# Patient Record
Sex: Female | Born: 1955
Health system: Southern US, Community
[De-identification: ages and names within clinical notes are randomized; demographics above are authoritative.]

## PROBLEM LIST (undated history)

## (undated) DIAGNOSIS — E785 Hyperlipidemia, unspecified: Secondary | ICD-10-CM

## (undated) DIAGNOSIS — Z9889 Other specified postprocedural states: Secondary | ICD-10-CM

## (undated) DIAGNOSIS — R112 Nausea with vomiting, unspecified: Secondary | ICD-10-CM

## (undated) DIAGNOSIS — T7840XA Allergy, unspecified, initial encounter: Secondary | ICD-10-CM

## (undated) DIAGNOSIS — E079 Disorder of thyroid, unspecified: Secondary | ICD-10-CM

## (undated) DIAGNOSIS — K219 Gastro-esophageal reflux disease without esophagitis: Secondary | ICD-10-CM

## (undated) DIAGNOSIS — H269 Unspecified cataract: Secondary | ICD-10-CM

## (undated) DIAGNOSIS — J449 Chronic obstructive pulmonary disease, unspecified: Secondary | ICD-10-CM

## (undated) DIAGNOSIS — I10 Essential (primary) hypertension: Secondary | ICD-10-CM

## (undated) HISTORY — DX: Unspecified cataract: H26.9

## (undated) HISTORY — PX: BUNIONECTOMY: SHX129

## (undated) HISTORY — PX: OTHER SURGICAL HISTORY: SHX169

## (undated) HISTORY — PX: APPENDECTOMY: SHX54

## (undated) HISTORY — DX: Allergy, unspecified, initial encounter: T78.40XA

## (undated) HISTORY — DX: Hyperlipidemia, unspecified: E78.5

## (undated) HISTORY — PX: CHOLECYSTECTOMY: SHX55

## (undated) HISTORY — PX: THYROIDECTOMY: SHX17

## (undated) HISTORY — DX: Gastro-esophageal reflux disease without esophagitis: K21.9

## (undated) HISTORY — PX: BLADDER SUSPENSION: SHX72

---

## 2008-06-18 ENCOUNTER — Emergency Department (HOSPITAL_COMMUNITY): Admission: EM | Admit: 2008-06-18 | Discharge: 2008-06-18 | Payer: Self-pay | Admitting: Emergency Medicine

## 2008-06-18 ENCOUNTER — Ambulatory Visit: Payer: Self-pay | Admitting: Diagnostic Radiology

## 2008-06-24 ENCOUNTER — Ambulatory Visit: Payer: Self-pay | Admitting: Diagnostic Radiology

## 2008-11-15 ENCOUNTER — Ambulatory Visit (HOSPITAL_BASED_OUTPATIENT_CLINIC_OR_DEPARTMENT_OTHER): Admission: RE | Admit: 2008-11-15 | Discharge: 2008-11-15 | Payer: Self-pay | Admitting: Plastic Surgery

## 2010-09-23 LAB — POCT HEMOGLOBIN-HEMACUE: Hemoglobin: 13.4 g/dL (ref 12.0–15.0)

## 2010-09-24 LAB — BASIC METABOLIC PANEL
BUN: 15 mg/dL (ref 6–23)
Chloride: 104 mEq/L (ref 96–112)
GFR calc Af Amer: >60 mL/min (ref >60)
GFR calc non Af Amer: >60 mL/min (ref >60)
Potassium: 4.3 mEq/L (ref 3.5–5.1)

## 2010-10-29 NOTE — Op Note (Signed)
NAMEBRAILEIGH, LANDENBERGER                   ACCOUNT NO.:  [id number]   MEDICAL RECORD NO.:  [id number]          PATIENT TYPE:  AMB   LOCATION:  DSC                          FACILITY:  [facility]   PHYSICIAN:  Loreta Ave, MD DATE OF BIRTH:  02/27/1956   DATE OF PROCEDURE:  11/15/2008  DATE OF DISCHARGE:                               OPERATIVE REPORT   PREOPERATIVE DIAGNOSIS:  Extensor tendon irritation secondary to left  distal radius volar plate.   POSTOPERATIVE DIAGNOSIS:  Extensor tendon irritation secondary to left  distal radius volar plate.   PROCEDURE PERFORMED:  Removal of left wrist volar plate.   SURGEON:  Loreta Ave, MD   ASSISTANT:  None.   ANESTHESIA:  General.   TOURNIQUET TIME:  20 minutes, 250 mmHg.   COMPLICATIONS:  None.   DRAINS:  None.   CLINICAL INDICATION:  Maxcine Strong is a 55 year old female who suffered a  left distal radius fracture in January 2010.  I saw her at that time and  recommended operative treatment and subsequently placed a Accumed Acu-  Loc distal radius plate on the volar aspect of her left distal radius.  She recovered very well until about 1 month ago when she returned to  clinic with complaints of pain along the dorsal aspect of her distal  radius corresponding to screws that were protruding through the dorsal  aspect of the distal radius.  She also had complaints of extensor tendon  pain to her index finger.  This seemed to have a focus about her distal  radius plate and radiate distally.  We discussed the options and tried  conservative therapy with anti-inflammatories for a few weeks.  Conservative therapy did not resolve her symptoms, and she presents at  this time for hardware explantation.   After discussion of the risks of surgery which include but are not  limited to bleeding, infection, distal radius fracture, fracture  collapse, failure to cure her symptoms.  Kyomi understands these risks and  desires to proceed.   DESCRIPTION OF THE OPERATION:  The patient was brought to the operating  room, placed in the supine position on the operating room table.  A well-  padded pneumatic tourniquet was placed on the left arm.  The left upper  extremity was prepped with Betadine scrub and paint and draped into a  sterile field.  It was exsanguinated with the Esmarch bandage and the  tourniquet inflated to 250 mmHg.  Her old scar along the volar surface  of the FCR tendon sheath on the skin was somewhat hypertrophic.  For  this reason, it was excised with a 15 blade.  The anterior aspect of the  FCR tendon sheath was incised with tenotomy scissors and the FCR tendon  retracted ulnarly.  Dissection proceeded along the pronator quadratus  and the plate was immediately visible.  Cicatrix was dissected off the  plate with a Key elevator.  Next, the screws in the plate were removed  in a stepwise fashion starting distally and progressing proximally.  After all screws were removed, the plate was gently elevated  off the  volar aspect of the distal radius.  All the screws were tightly adherent  to the plate and to the distal radius.  There was no screw loosening.  Next, the wound was irrigated with normal saline and 10 mL of 0.5%  Marcaine plain were injected about the wound and along the skin edges.  The skin was then closed with 4-0 buried interrupted Monocryl sutures  and a running 4-0 subcuticular Monocryl stitch.  Steri-Strips and  Benzoin were applied.  The tourniquet was deflated and pink color was  noted to return in all fingers of the left hand.  Volar resting splint  was applied.  Sponge and needle counts were reported as correct.  The  patient was extubated and transferred to the recovery room in stable  condition.      Loreta Ave, MD  Electronically Signed     CF/MEDQ  D:  11/15/2008  T:  11/15/2008  Job:  [id number]

## 2011-05-08 ENCOUNTER — Encounter: Payer: Self-pay | Admitting: *Deleted

## 2011-05-08 ENCOUNTER — Emergency Department (HOSPITAL_BASED_OUTPATIENT_CLINIC_OR_DEPARTMENT_OTHER)
Admission: EM | Admit: 2011-05-08 | Discharge: 2011-05-08 | Disposition: A | Discharge status: Home / Self Care | Payer: BC Managed Care – PPO | Attending: Emergency Medicine | Admitting: Emergency Medicine

## 2011-05-08 DIAGNOSIS — N390 Urinary tract infection, site not specified: Secondary | ICD-10-CM

## 2011-05-08 DIAGNOSIS — I10 Essential (primary) hypertension: Secondary | ICD-10-CM | POA: Insufficient documentation

## 2011-05-08 DIAGNOSIS — E079 Disorder of thyroid, unspecified: Secondary | ICD-10-CM | POA: Insufficient documentation

## 2011-05-08 HISTORY — DX: Essential (primary) hypertension: I10

## 2011-05-08 HISTORY — DX: Disorder of thyroid, unspecified: E07.9

## 2011-05-08 LAB — URINALYSIS, ROUTINE W REFLEX MICROSCOPIC: pH: 5 (ref 5.0–8.0)

## 2011-05-08 LAB — URINE MICROSCOPIC-ADD ON

## 2011-05-08 MED ORDER — CIPROFLOXACIN HCL 250 MG PO TABS: 250.0000 mg | ORAL_TABLET | Freq: Two times a day (BID) | ORAL | Status: AC

## 2011-05-08 NOTE — ED Notes (Signed)
Pt amb to room 6 with quick steady gait in nad. Pt reports dysuria x last night, and the usual pressure and symptoms of her frequent uti's. Denies fever or any other c/o.

## 2011-05-08 NOTE — ED Provider Notes (Signed)
History     CSN: [id number] Arrival date & time: 05/08/2011  8:17 AM   First MD Initiated Contact with Patient 05/08/11 0827      Chief Complaint  Patient presents with  . Urinary Tract Infection    (Consider location/radiation/quality/duration/timing/severity/associated sxs/prior treatment) HPI  55 year old female presents with concern for UTI. Patient states that since last night she has experience dysuria, frequency, urgency. She denies abdominal pain, nausea, vomiting. Denies fever, chills. She denies back pain. She denies vaginal discharge. She states she's had similar symptoms 3 times in the past 6 months and has been diagnosed with urinary tract infection her primary care provider. She took pyridium to arrival.       Lanice Schwab, RN 05/08/2011 08:16      Pt amb to room 6 with quick steady gait in nad. Pt reports dysuria x last night, and the usual pressure and symptoms of her frequent uti's. Denies fever or any other c/o.     Past Medical History  Diagnosis Date  . Hypertension   . Thyroid disease     History reviewed. No pertinent past surgical history.  History reviewed. No pertinent family history.  History  Substance Use Topics  . Smoking status: Not on file  . Smokeless tobacco: Not on file  . Alcohol Use:     OB History    Grav Para Term Preterm Abortions TAB SAB Ect Mult Living                  Review of Systems  All other systems reviewed and are negative.   Except As noted history of present illness Allergies  Penicillins  Home Medications   Current Outpatient Rx  Name Route Sig Dispense Refill  . ATENOLOL 50 MG PO TABS Oral Take 50 mg by mouth daily.      Marland Kitchen LEVOTHYROXINE SODIUM 125 MCG PO TABS Oral Take 125 mcg by mouth daily.      Marland Kitchen LISINOPRIL 10 MG PO TABS Oral Take 10 mg by mouth daily.      Marland Kitchen CIPROFLOXACIN HCL 250 MG PO TABS Oral Take 1 tablet (250 mg total) by mouth every 12 (twelve) hours. 6 tablet 0    BP 136/82  Pulse 82   Temp(Src) 98 F (36.7 C) (Oral)  Resp 18  SpO2 100%  Physical Exam  Nursing note and vitals reviewed. Constitutional: She is oriented to person, place, and time. She appears well-developed.  HENT:  Head: Atraumatic.  Mouth/Throat: Oropharynx is clear and moist.  Eyes: Conjunctivae and EOM are normal. Pupils are equal, round, and reactive to light.  Neck: Normal range of motion. Neck supple.  Cardiovascular: Normal rate, regular rhythm, normal heart sounds and intact distal pulses.   Pulmonary/Chest: Effort normal and breath sounds normal. No respiratory distress. She has no wheezes. She has no rales.  Abdominal: Soft. She exhibits no distension. There is no tenderness. There is no rebound and no guarding.       No CVAT  Musculoskeletal: Normal range of motion.  Neurological: She is alert and oriented to person, place, and time.  Skin: Skin is warm and dry. No rash noted.  Psychiatric: She has a normal mood and affect.    ED Course  Procedures (including critical care time)  Labs Reviewed  URINALYSIS, ROUTINE W REFLEX MICROSCOPIC - Abnormal; Notable for the following:    Color, Urine ORANGE (*) BIOCHEMICALS MAY BE AFFECTED BY COLOR   Appearance TURBID (*)    Glucose,  UA   (*)    Value: TEST NOT REPORTED DUE TO COLOR INTERFERENCE OF URINE PIGMENT   Hgb urine dipstick   (*)    Value: TEST NOT REPORTED DUE TO COLOR INTERFERENCE OF URINE PIGMENT   Bilirubin Urine   (*)    Value: TEST NOT REPORTED DUE TO COLOR INTERFERENCE OF URINE PIGMENT   Ketones, ur   (*)    Value: TEST NOT REPORTED DUE TO COLOR INTERFERENCE OF URINE PIGMENT   Protein, ur   (*)    Value: TEST NOT REPORTED DUE TO COLOR INTERFERENCE OF URINE PIGMENT   Nitrite   (*)    Value: TEST NOT REPORTED DUE TO COLOR INTERFERENCE OF URINE PIGMENT   Leukocytes, UA   (*)    Value: TEST NOT REPORTED DUE TO COLOR INTERFERENCE OF URINE PIGMENT   All other components within normal limits  URINE MICROSCOPIC-ADD ON - Abnormal;  Notable for the following:    Bacteria, UA FEW (*)    All other components within normal limits   No results found.   1. UTI (lower urinary tract infection)       MDM  Sx of UTI. Pt took pyridium PTA and UA as above- unable to assess 2/2 orange color. There is bacteria present. Will send for culture. Cipro. PMD f/u. Precautions for return.  Stefano Gaul, MD         Forbes Cellar, MD 05/08/11 [phone]

## 2011-05-09 LAB — URINE CULTURE
Colony Count: >=100000
Culture  Setup Time: 201211221746

## 2012-07-05 ENCOUNTER — Emergency Department (HOSPITAL_BASED_OUTPATIENT_CLINIC_OR_DEPARTMENT_OTHER)
Admission: EM | Admit: 2012-07-05 | Discharge: 2012-07-05 | Disposition: A | Discharge status: Home / Self Care | Payer: BC Managed Care – PPO | Attending: Emergency Medicine | Admitting: Emergency Medicine

## 2012-07-05 ENCOUNTER — Encounter (HOSPITAL_BASED_OUTPATIENT_CLINIC_OR_DEPARTMENT_OTHER): Payer: Self-pay | Admitting: *Deleted

## 2012-07-05 DIAGNOSIS — E079 Disorder of thyroid, unspecified: Secondary | ICD-10-CM | POA: Insufficient documentation

## 2012-07-05 DIAGNOSIS — Z79899 Other long term (current) drug therapy: Secondary | ICD-10-CM | POA: Insufficient documentation

## 2012-07-05 DIAGNOSIS — I10 Essential (primary) hypertension: Secondary | ICD-10-CM | POA: Insufficient documentation

## 2012-07-05 MED ORDER — METOPROLOL TARTRATE 50 MG PO TABS
25.0000 mg | ORAL_TABLET | Freq: Once | ORAL | Status: AC
  Administered 2012-07-05: 25 mg via ORAL
  Filled 2012-07-05: qty 1

## 2012-07-05 MED ORDER — METOPROLOL TARTRATE 50 MG PO TABS: 50.0000 mg | ORAL_TABLET | Freq: Once | ORAL | Status: DC

## 2012-07-05 MED ORDER — ALPRAZOLAM 0.5 MG PO TABS
0.2500 mg | ORAL_TABLET | Freq: Once | ORAL | Status: AC
Start: 2012-07-05 — End: 2012-07-05
  Administered 2012-07-05: 0.25 mg via ORAL
  Filled 2012-07-05: qty 1

## 2012-07-05 NOTE — ED Provider Notes (Signed)
History     CSN: [id number]  Arrival date & time 07/05/12  2137   First MD Initiated Contact with Patient 07/05/12 2152      Chief Complaint  Patient presents with  . Hypertension    (Consider location/radiation/quality/duration/timing/severity/associated sxs/prior treatment) HPI 57yo female with c/o hypertension and went to her pcp today.  She was on lisionpril and atenolol and her doctor changed her to lisinopril 10mg  once a day and lopressor 50mg  bid.  States she's been having frequent headaches but not today. She's also had vision problems with blurred vision but not today. States that her blood pressure at home was 215/106. Denies any weakness, fever, stroke symptoms.    Past Medical History  Diagnosis Date  . Hypertension   . Thyroid disease     Past Surgical History  Procedure Date  . Thyroidectomy     No family history on file.  History  Substance Use Topics  . Smoking status: Never Smoker   . Smokeless tobacco: Not on file  . Alcohol Use: No    OB History    Grav Para Term Preterm Abortions TAB SAB Ect Mult Living                  Review of Systems  Constitutional: Negative.   HENT: Negative.   Eyes: Negative.   Respiratory: Negative.   Cardiovascular: Negative.   Gastrointestinal: Negative.   Neurological: Negative.   Psychiatric/Behavioral: Negative.   All other systems reviewed and are negative.    Allergies  Penicillins  Home Medications   Current Outpatient Rx  Name  Route  Sig  Dispense  Refill  . ATENOLOL 50 MG PO TABS   Oral   Take 50 mg by mouth daily.           Marland Kitchen LEVOTHYROXINE SODIUM 125 MCG PO TABS   Oral   Take 125 mcg by mouth daily.           Marland Kitchen LISINOPRIL 10 MG PO TABS   Oral   Take 10 mg by mouth daily.             BP 190/101  Pulse 65  Temp 97.7 F (36.5 C) (Oral)  Resp 20  Ht 5\' 10"  (1.778 m)  Wt 188 lb (85.276 kg)  BMI 26.98 kg/m2  SpO2 100%  Physical Exam  Nursing note and vitals  reviewed. Constitutional: She is oriented to person, place, and time. She appears well-developed and well-nourished.  HENT:  Head: Normocephalic and atraumatic.  Eyes: Conjunctivae normal and EOM are normal. Pupils are equal, round, and reactive to light.  Neck: Normal range of motion. Neck supple.  Cardiovascular: Normal rate.   Pulmonary/Chest: Effort normal and breath sounds normal. No respiratory distress.  Abdominal: Soft.  Musculoskeletal: Normal range of motion. She exhibits no edema and no tenderness.  Neurological: She is alert and oriented to person, place, and time. She has normal strength and normal reflexes. No cranial nerve deficit or sensory deficit. GCS eye subscore is 4. GCS verbal subscore is 5. GCS motor subscore is 6.  Skin: Skin is warm and dry.  Psychiatric: She has a normal mood and affect.       anxious    ED Course  Procedures (including critical care time)  Labs Reviewed - No data to display No results found.   No diagnosis found.    MDM  Hypertension/anxiety. Xanax and  Lopressor 25mg  in the ER with improvement.  She will call her  pcp tomorrow and tell them she was in the ER.  Continue new meds in am as planned.         Remi Haggard, NP 07/06/12 1916

## 2012-07-05 NOTE — ED Notes (Signed)
Hypertension. Here because she feels shaky and nervous. Was seen by her MD today for same and he gave her a Rx for metoprolol and told her to start it tomorrow.

## 2012-07-06 NOTE — ED Provider Notes (Signed)
Medical screening examination/treatment/procedure(s) were performed by non-physician practitioner and as supervising physician I was immediately available for consultation/collaboration.   Christinea Brizuela B. Wyndham Santilli, MD 07/06/12 1954 

## 2013-03-16 ENCOUNTER — Other Ambulatory Visit (HOSPITAL_BASED_OUTPATIENT_CLINIC_OR_DEPARTMENT_OTHER): Payer: Self-pay | Admitting: Internal Medicine

## 2013-03-16 DIAGNOSIS — N949 Unspecified condition associated with female genital organs and menstrual cycle: Secondary | ICD-10-CM

## 2013-03-22 ENCOUNTER — Ambulatory Visit (INDEPENDENT_AMBULATORY_CARE_PROVIDER_SITE_OTHER): Payer: BC Managed Care – PPO

## 2013-03-22 DIAGNOSIS — N949 Unspecified condition associated with female genital organs and menstrual cycle: Secondary | ICD-10-CM

## 2013-03-22 DIAGNOSIS — N859 Noninflammatory disorder of uterus, unspecified: Secondary | ICD-10-CM

## 2013-03-22 MED ORDER — GADOBENATE DIMEGLUMINE 529 MG/ML IV SOLN
17.0000 mL | Freq: Once | INTRAVENOUS | Status: AC | PRN
  Administered 2013-03-22: 17 mL via INTRAVENOUS

## 2015-12-03 DIAGNOSIS — E038 Other specified hypothyroidism: Secondary | ICD-10-CM | POA: Diagnosis not present

## 2015-12-03 DIAGNOSIS — N390 Urinary tract infection, site not specified: Secondary | ICD-10-CM | POA: Diagnosis not present

## 2015-12-03 DIAGNOSIS — M25552 Pain in left hip: Secondary | ICD-10-CM | POA: Diagnosis not present

## 2015-12-03 DIAGNOSIS — R8299 Other abnormal findings in urine: Secondary | ICD-10-CM | POA: Diagnosis not present

## 2015-12-10 DIAGNOSIS — M542 Cervicalgia: Secondary | ICD-10-CM | POA: Diagnosis not present

## 2015-12-10 DIAGNOSIS — M25552 Pain in left hip: Secondary | ICD-10-CM | POA: Diagnosis not present

## 2015-12-10 DIAGNOSIS — M25551 Pain in right hip: Secondary | ICD-10-CM | POA: Diagnosis not present

## 2015-12-14 DIAGNOSIS — L237 Allergic contact dermatitis due to plants, except food: Secondary | ICD-10-CM | POA: Diagnosis not present

## 2016-01-21 DIAGNOSIS — Z780 Asymptomatic menopausal state: Secondary | ICD-10-CM | POA: Diagnosis not present

## 2016-01-22 DIAGNOSIS — R3121 Asymptomatic microscopic hematuria: Secondary | ICD-10-CM | POA: Diagnosis not present

## 2016-01-22 DIAGNOSIS — N3941 Urge incontinence: Secondary | ICD-10-CM | POA: Diagnosis not present

## 2016-01-22 DIAGNOSIS — R32 Unspecified urinary incontinence: Secondary | ICD-10-CM | POA: Diagnosis not present

## 2016-01-22 DIAGNOSIS — N9412 Deep dyspareunia: Secondary | ICD-10-CM | POA: Diagnosis not present

## 2016-04-16 DIAGNOSIS — Z23 Encounter for immunization: Secondary | ICD-10-CM | POA: Diagnosis not present

## 2016-04-26 DIAGNOSIS — I10 Essential (primary) hypertension: Secondary | ICD-10-CM | POA: Diagnosis not present

## 2016-04-26 DIAGNOSIS — L255 Unspecified contact dermatitis due to plants, except food: Secondary | ICD-10-CM | POA: Diagnosis not present

## 2016-05-21 DIAGNOSIS — H16211 Exposure keratoconjunctivitis, right eye: Secondary | ICD-10-CM | POA: Diagnosis not present

## 2016-05-21 DIAGNOSIS — H26491 Other secondary cataract, right eye: Secondary | ICD-10-CM | POA: Diagnosis not present

## 2016-06-11 ENCOUNTER — Encounter: Payer: Self-pay | Admitting: Podiatry

## 2016-06-11 ENCOUNTER — Ambulatory Visit (INDEPENDENT_AMBULATORY_CARE_PROVIDER_SITE_OTHER): Payer: BLUE CROSS/BLUE SHIELD | Admitting: Podiatry

## 2016-06-11 VITALS — Ht 70.0 in | Wt 198.0 lb

## 2016-06-11 DIAGNOSIS — M7750 Other enthesopathy of unspecified foot: Secondary | ICD-10-CM

## 2016-06-11 DIAGNOSIS — M79672 Pain in left foot: Secondary | ICD-10-CM

## 2016-06-11 NOTE — Progress Notes (Signed)
SUBJECTIVE: 60 y.o. year old female presents complaining of pain in between the 1st web space dorsum of left foot x 2 years.    S/P Left foot Cotton osteotomy about 3-4 years ago.  REVIEW OF SYSTEMS: Pertinent items noted in HPI and remainder of comprehensive ROS otherwise negative.  OBJECTIVE: DERMATOLOGIC EXAMINATION: Old scar over the first cuneiform bone from old surgery, Cotton osteotomy.   VASCULAR EXAMINATION OF LOWER LIMBS: All pedal pulses are palpable with normal pulsation.  Capillary Filling times within 3 seconds in all digits.  No edema or erythema noted.Temperature gradient from tibial crest to dorsum of foot is within normal bilateral.  NEUROLOGIC EXAMINATION OF THE LOWER LIMBS: All epicritic and tactile sensations grossly intact. Sharp and Dull discriminatory sensations at the plantar ball of hallux is intact bilateral.   MUSCULOSKELETAL EXAMINATION: Positive for hyperextended left great toe at the MPJ. Enlarged bony prominence at lateral aspect of the great toe and medial aspect of the 2nd toe left foot. Protruding condyle at the base of the proximal phalanx of the great toe upon plantar flexion at IPJ.  ASSESSMENT: Painful condyle at proximal phalanx left great toe upon plantar flexion of the great toe left foot. Bone spur 1st and 2nd toe left.  PLAN: Reviewed findings and available options. Possible lengthening of the EHL and removal of bone spur from the first and 2nd toe reviewed. Patient will return with her decision.

## 2016-06-11 NOTE — Patient Instructions (Signed)
Pain on left foot in between the 1st and 2nd toe. Noted of bony protrusion when the great toe is contracted. Noted of tight Great toe tendon and bone spur at the 1st and 2nd contact space. May reduce friction pain by lengthening the great toe tendon and reduce bone spurs from the first and 2nd toe left foot. Return for pre op.

## 2016-08-28 DIAGNOSIS — E038 Other specified hypothyroidism: Secondary | ICD-10-CM | POA: Diagnosis not present

## 2016-08-28 DIAGNOSIS — R8299 Other abnormal findings in urine: Secondary | ICD-10-CM | POA: Diagnosis not present

## 2016-08-28 DIAGNOSIS — M859 Disorder of bone density and structure, unspecified: Secondary | ICD-10-CM | POA: Diagnosis not present

## 2016-08-28 DIAGNOSIS — R358 Other polyuria: Secondary | ICD-10-CM | POA: Diagnosis not present

## 2016-08-28 DIAGNOSIS — I10 Essential (primary) hypertension: Secondary | ICD-10-CM | POA: Diagnosis not present

## 2016-09-04 DIAGNOSIS — M25559 Pain in unspecified hip: Secondary | ICD-10-CM | POA: Diagnosis not present

## 2016-09-04 DIAGNOSIS — M542 Cervicalgia: Secondary | ICD-10-CM | POA: Diagnosis not present

## 2016-09-04 DIAGNOSIS — Z6828 Body mass index (BMI) 28.0-28.9, adult: Secondary | ICD-10-CM | POA: Diagnosis not present

## 2016-09-04 DIAGNOSIS — Z1389 Encounter for screening for other disorder: Secondary | ICD-10-CM | POA: Diagnosis not present

## 2016-09-04 DIAGNOSIS — Z1231 Encounter for screening mammogram for malignant neoplasm of breast: Secondary | ICD-10-CM | POA: Diagnosis not present

## 2016-09-04 DIAGNOSIS — R3121 Asymptomatic microscopic hematuria: Secondary | ICD-10-CM | POA: Diagnosis not present

## 2016-09-04 DIAGNOSIS — M859 Disorder of bone density and structure, unspecified: Secondary | ICD-10-CM | POA: Diagnosis not present

## 2016-09-04 DIAGNOSIS — Z0000 Encounter for general adult medical examination without abnormal findings: Secondary | ICD-10-CM | POA: Diagnosis not present

## 2016-09-04 DIAGNOSIS — Z23 Encounter for immunization: Secondary | ICD-10-CM | POA: Diagnosis not present

## 2016-09-09 ENCOUNTER — Telehealth: Payer: Self-pay | Admitting: Gastroenterology

## 2016-09-09 ENCOUNTER — Encounter: Payer: Self-pay | Admitting: Gastroenterology

## 2016-09-09 NOTE — Telephone Encounter (Signed)
Records placed on 09/05/16 DOD Dr.Danis' desk for review.

## 2016-09-09 NOTE — Telephone Encounter (Signed)
Patient has been scheduled for direct colonoscopy  

## 2016-09-09 NOTE — Telephone Encounter (Signed)
Thank you for obtaining the outside records for review.  While she only had hyperplastic polyps in 12/2010, the prep was recorded as Inadequate (with Suprep).  Therefore, she is due for a colonoscopy.  It can be directly booked with me after nurse visit in [facility].  Dx: history of colon polyps  Prep:  Miralax/Suprep 2 day prep

## 2016-09-10 DIAGNOSIS — Z1212 Encounter for screening for malignant neoplasm of rectum: Secondary | ICD-10-CM | POA: Diagnosis not present

## 2016-10-01 DIAGNOSIS — H5203 Hypermetropia, bilateral: Secondary | ICD-10-CM | POA: Diagnosis not present

## 2016-10-01 DIAGNOSIS — H353131 Nonexudative age-related macular degeneration, bilateral, early dry stage: Secondary | ICD-10-CM | POA: Diagnosis not present

## 2016-10-09 DIAGNOSIS — H43811 Vitreous degeneration, right eye: Secondary | ICD-10-CM | POA: Diagnosis not present

## 2016-10-09 DIAGNOSIS — H2512 Age-related nuclear cataract, left eye: Secondary | ICD-10-CM | POA: Diagnosis not present

## 2016-10-15 ENCOUNTER — Encounter: Payer: Self-pay | Admitting: Gastroenterology

## 2016-10-15 ENCOUNTER — Ambulatory Visit (AMBULATORY_SURGERY_CENTER): Payer: Self-pay

## 2016-10-15 VITALS — Ht 70.0 in | Wt 202.6 lb

## 2016-10-15 DIAGNOSIS — Z86010 Personal history of colonic polyps: Secondary | ICD-10-CM

## 2016-10-15 MED ORDER — NA SULFATE-K SULFATE-MG SULF 17.5-3.13-1.6 GM/177ML PO SOLN: 1.0000 | Freq: Once | ORAL | 0 refills | Status: AC

## 2016-10-15 NOTE — Progress Notes (Signed)
Denies allergies to eggs or soy products. Denies complication of anesthesia or sedation. Denies use of weight loss medication. Denies use of O2.   Emmi instructions given for colonoscopy.  

## 2016-10-21 DIAGNOSIS — R3129 Other microscopic hematuria: Secondary | ICD-10-CM | POA: Diagnosis not present

## 2016-10-21 DIAGNOSIS — R3121 Asymptomatic microscopic hematuria: Secondary | ICD-10-CM | POA: Diagnosis not present

## 2016-10-21 DIAGNOSIS — R32 Unspecified urinary incontinence: Secondary | ICD-10-CM | POA: Diagnosis not present

## 2016-10-27 DIAGNOSIS — H1131 Conjunctival hemorrhage, right eye: Secondary | ICD-10-CM | POA: Diagnosis not present

## 2016-10-29 ENCOUNTER — Ambulatory Visit (AMBULATORY_SURGERY_CENTER): Payer: BLUE CROSS/BLUE SHIELD | Admitting: Gastroenterology

## 2016-10-29 ENCOUNTER — Encounter: Payer: Self-pay | Admitting: Gastroenterology

## 2016-10-29 VITALS — BP 112/62 | HR 55 | Temp 97.3°F | Resp 10 | Ht 70.0 in | Wt 202.0 lb

## 2016-10-29 DIAGNOSIS — Z1211 Encounter for screening for malignant neoplasm of colon: Secondary | ICD-10-CM

## 2016-10-29 DIAGNOSIS — Z1212 Encounter for screening for malignant neoplasm of rectum: Secondary | ICD-10-CM

## 2016-10-29 DIAGNOSIS — Z86010 Personal history of colonic polyps: Secondary | ICD-10-CM

## 2016-10-29 MED ORDER — SODIUM CHLORIDE 0.9 % IV SOLN: 500.0000 mL | INTRAVENOUS | Status: DC

## 2016-10-29 NOTE — Patient Instructions (Signed)
YOU HAD AN ENDOSCOPIC PROCEDURE TODAY AT THE [facility]:   Refer to the procedure report that was given to you for any specific questions about what was found during the examination.  If the procedure report does not answer your questions, please call your gastroenterologist to clarify.  If you requested that your care partner not be given the details of your procedure findings, then the procedure report has been included in a sealed envelope for you to review at your convenience later.  YOU SHOULD EXPECT: Some feelings of bloating in the abdomen. Passage of more gas than usual.  Walking can help get rid of the air that was put into your GI tract during the procedure and reduce the bloating. If you had a lower endoscopy (such as a colonoscopy or flexible sigmoidoscopy) you may notice spotting of blood in your stool or on the toilet paper. If you underwent a bowel prep for your procedure, you may not have a normal bowel movement for a few days.  Please Note:  You might notice some irritation and congestion in your nose or some drainage.  This is from the oxygen used during your procedure.  There is no need for concern and it should clear up in a day or so.  SYMPTOMS TO REPORT IMMEDIATELY:   Following lower endoscopy (colonoscopy or flexible sigmoidoscopy):  Excessive amounts of blood in the stool  Significant tenderness or worsening of abdominal pains  Swelling of the abdomen that is new, acute  Fever of 100F or higher   For urgent or emergent issues, a gastroenterologist can be reached at any hour by calling [phone].   DIET:  We do recommend a small meal at first, but then you may proceed to your regular diet.  Drink plenty of fluids but you should avoid alcoholic beverages for 24 hours.  ACTIVITY:  You should plan to take it easy for the rest of today and you should NOT DRIVE or use heavy machinery until tomorrow (because of the sedation medicines used during the test).     FOLLOW UP: Our staff will call the number listed on your records the next business day following your procedure to check on you and address any questions or concerns that you may have regarding the information given to you following your procedure. If we do not reach you, we will leave a message.  However, if you are feeling well and you are not experiencing any problems, there is no need to return our call.  We will assume that you have returned to your regular daily activities without incident.  If any biopsies were taken you will be contacted by phone or by letter within the next 1-3 weeks.  Please call us at [phone] if you have not heard about the biopsies in 3 weeks.    SIGNATURES/CONFIDENTIALITY: You and/or your care partner have signed paperwork which will be entered into your electronic medical record.  These signatures attest to the fact that that the information above on your After Visit Summary has been reviewed and is understood.  Full responsibility of the confidentiality of this discharge information lies with you and/or your care-partner.   Resume medications.  

## 2016-10-29 NOTE — Op Note (Signed)
[facility] Patient Name: Jennifer Bradley Procedure Date: 10/29/2016 10:37 AM MRN: [id number] Endoscopist: Sherilyn Cooter L. Myrtie Neither , MD Age: 61 Referring MD:  Date of Birth: [birth year] Gender: Female Account #: [id number] Procedure:                Colonoscopy Indications:              Surveillance: Personal history of hyperplastic                            polyps on last colonoscopy 12/2010, but that report                            listed bowel preparation as inadequate. Medicines:                Monitored Anesthesia Care Procedure:                Pre-Anesthesia Assessment:                           - Prior to the procedure, a History and Physical                            was performed, and patient medications and                            allergies were reviewed. The patient's tolerance of                            previous anesthesia was also reviewed. The risks                            and benefits of the procedure and the sedation                            options and risks were discussed with the patient.                            All questions were answered, and informed consent                            was obtained. Prior Anticoagulants: The patient has                            taken no previous anticoagulant or antiplatelet                            agents. ASA Grade Assessment: II - A patient with                            mild systemic disease. After reviewing the risks                            and benefits, the patient was deemed in  satisfactory condition to undergo the procedure.                           After obtaining informed consent, the colonoscope                            was passed under direct vision. Throughout the                            procedure, the patient's blood pressure, pulse, and                            oxygen saturations were monitored continuously. The                            Colonoscope was introduced  through the anus and                            advanced to the the cecum, identified by                            appendiceal orifice and ileocecal valve. The                            colonoscopy was performed without difficulty. The                            patient tolerated the procedure well. The quality                            of the bowel preparation was good. The appendiceal                            orifice and rectum were photographed. The quality                            of the bowel preparation was evaluated using the                            BBPS [facility] Bowel Preparation Scale) with scores                            of: Right Colon = 2, Transverse Colon = 2 and Left                            Colon = 2. The total BBPS score equals 6. The bowel                            preparation used was Miralax and SUPREP (2 day). Scope In: 10:45:02 AM Scope Out: 11:01:37 AM Scope Withdrawal Time: 0 hours 12 minutes 1 second  Total Procedure Duration: 0 hours 16 minutes 35 seconds  Findings:  The perianal and digital rectal examinations were                            normal.                           The colon (entire examined portion) was moderately                            redundant.                           There were three areas of pale recal mucosa that                            looked like prior cautery polypectomy sites.                           Retroflexion in the rectum was not performed due to                            anatomy (small).                           The exam was otherwise without abnormality. Complications:            No immediate complications. Estimated Blood Loss:     Estimated blood loss: none. Impression:               - Redundant colon.                           - The examination was otherwise normal.                           - No specimens collected. Recommendation:           - Patient has a contact number available for                             emergencies. The signs and symptoms of potential                            delayed complications were discussed with the                            patient. Return to normal activities tomorrow.                            Written discharge instructions were provided to the                            patient.                           - Resume previous diet.                           -  Continue present medications.                           - Repeat colonoscopy in 10 years for screening                            purposes. Britton Perkinson L. Myrtie Neitheranis, MD 10/29/2016 11:10:46 AM This report has been signed electronically.

## 2016-10-29 NOTE — Progress Notes (Signed)
Patient awakening,vss,report to rn 

## 2016-10-30 ENCOUNTER — Telehealth: Payer: Self-pay

## 2016-10-30 NOTE — Telephone Encounter (Signed)
  Follow up Call-  Call back number 10/29/2016  Post procedure Call Back phone  # [phone]  Permission to leave phone message Yes  Some recent data might be hidden     Patient questions:  Do you have a fever, pain , or abdominal swelling? No. Pain Score  0 *  Have you tolerated food without any problems? Yes.    Have you been able to return to your normal activities? Yes.    Do you have any questions about your discharge instructions: Diet   No. Medications  No. Follow up visit  No.  Do you have questions or concerns about your Care? No.  Actions: * If pain score is 4 or above: No action needed, pain <4.

## 2016-10-31 DIAGNOSIS — R3121 Asymptomatic microscopic hematuria: Secondary | ICD-10-CM | POA: Diagnosis not present

## 2016-10-31 DIAGNOSIS — R3129 Other microscopic hematuria: Secondary | ICD-10-CM | POA: Diagnosis not present

## 2016-11-04 DIAGNOSIS — N3941 Urge incontinence: Secondary | ICD-10-CM | POA: Diagnosis not present

## 2016-11-18 DIAGNOSIS — R3129 Other microscopic hematuria: Secondary | ICD-10-CM | POA: Diagnosis not present

## 2016-11-18 DIAGNOSIS — R32 Unspecified urinary incontinence: Secondary | ICD-10-CM | POA: Diagnosis not present

## 2016-11-19 DIAGNOSIS — Z1231 Encounter for screening mammogram for malignant neoplasm of breast: Secondary | ICD-10-CM | POA: Diagnosis not present

## 2016-12-02 DIAGNOSIS — R278 Other lack of coordination: Secondary | ICD-10-CM | POA: Diagnosis not present

## 2016-12-02 DIAGNOSIS — M6281 Muscle weakness (generalized): Secondary | ICD-10-CM | POA: Diagnosis not present

## 2016-12-02 DIAGNOSIS — R3913 Splitting of urinary stream: Secondary | ICD-10-CM | POA: Diagnosis not present

## 2016-12-02 DIAGNOSIS — N3941 Urge incontinence: Secondary | ICD-10-CM | POA: Diagnosis not present

## 2016-12-04 DIAGNOSIS — Z124 Encounter for screening for malignant neoplasm of cervix: Secondary | ICD-10-CM | POA: Diagnosis not present

## 2016-12-04 DIAGNOSIS — N83292 Other ovarian cyst, left side: Secondary | ICD-10-CM | POA: Diagnosis not present

## 2016-12-13 DIAGNOSIS — N390 Urinary tract infection, site not specified: Secondary | ICD-10-CM | POA: Diagnosis not present

## 2017-03-02 DIAGNOSIS — H26491 Other secondary cataract, right eye: Secondary | ICD-10-CM | POA: Diagnosis not present

## 2017-03-02 DIAGNOSIS — H2512 Age-related nuclear cataract, left eye: Secondary | ICD-10-CM | POA: Diagnosis not present

## 2017-03-02 DIAGNOSIS — Z961 Presence of intraocular lens: Secondary | ICD-10-CM | POA: Diagnosis not present

## 2017-03-20 DIAGNOSIS — Z23 Encounter for immunization: Secondary | ICD-10-CM | POA: Diagnosis not present

## 2017-04-21 DIAGNOSIS — H179 Unspecified corneal scar and opacity: Secondary | ICD-10-CM | POA: Diagnosis not present

## 2017-04-21 DIAGNOSIS — Z961 Presence of intraocular lens: Secondary | ICD-10-CM | POA: Diagnosis not present

## 2017-04-21 DIAGNOSIS — H2512 Age-related nuclear cataract, left eye: Secondary | ICD-10-CM | POA: Diagnosis not present

## 2017-04-22 DIAGNOSIS — H903 Sensorineural hearing loss, bilateral: Secondary | ICD-10-CM | POA: Diagnosis not present

## 2017-04-22 DIAGNOSIS — H9312 Tinnitus, left ear: Secondary | ICD-10-CM | POA: Diagnosis not present

## 2017-04-22 DIAGNOSIS — R221 Localized swelling, mass and lump, neck: Secondary | ICD-10-CM | POA: Diagnosis not present

## 2017-08-20 DIAGNOSIS — H9313 Tinnitus, bilateral: Secondary | ICD-10-CM | POA: Diagnosis not present

## 2017-08-20 DIAGNOSIS — I10 Essential (primary) hypertension: Secondary | ICD-10-CM | POA: Diagnosis not present

## 2017-08-20 DIAGNOSIS — R0789 Other chest pain: Secondary | ICD-10-CM | POA: Diagnosis not present

## 2017-08-20 DIAGNOSIS — R7301 Impaired fasting glucose: Secondary | ICD-10-CM | POA: Diagnosis not present

## 2017-08-21 DIAGNOSIS — I10 Essential (primary) hypertension: Secondary | ICD-10-CM | POA: Diagnosis not present

## 2017-08-21 DIAGNOSIS — Z720 Tobacco use: Secondary | ICD-10-CM | POA: Diagnosis not present

## 2017-08-21 DIAGNOSIS — E782 Mixed hyperlipidemia: Secondary | ICD-10-CM | POA: Diagnosis not present

## 2017-08-21 DIAGNOSIS — R0789 Other chest pain: Secondary | ICD-10-CM | POA: Diagnosis not present

## 2017-08-31 DIAGNOSIS — R0789 Other chest pain: Secondary | ICD-10-CM | POA: Diagnosis not present

## 2017-08-31 DIAGNOSIS — I10 Essential (primary) hypertension: Secondary | ICD-10-CM | POA: Diagnosis not present

## 2017-08-31 DIAGNOSIS — Z8249 Family history of ischemic heart disease and other diseases of the circulatory system: Secondary | ICD-10-CM | POA: Diagnosis not present

## 2017-09-10 DIAGNOSIS — I10 Essential (primary) hypertension: Secondary | ICD-10-CM | POA: Diagnosis not present

## 2017-09-10 DIAGNOSIS — R0789 Other chest pain: Secondary | ICD-10-CM | POA: Diagnosis not present

## 2017-09-10 DIAGNOSIS — Z8249 Family history of ischemic heart disease and other diseases of the circulatory system: Secondary | ICD-10-CM | POA: Diagnosis not present

## 2017-09-18 DIAGNOSIS — Z720 Tobacco use: Secondary | ICD-10-CM | POA: Diagnosis not present

## 2017-09-18 DIAGNOSIS — E782 Mixed hyperlipidemia: Secondary | ICD-10-CM | POA: Diagnosis not present

## 2017-09-18 DIAGNOSIS — I10 Essential (primary) hypertension: Secondary | ICD-10-CM | POA: Diagnosis not present

## 2017-09-18 DIAGNOSIS — R0789 Other chest pain: Secondary | ICD-10-CM | POA: Diagnosis not present

## 2017-10-15 DIAGNOSIS — I10 Essential (primary) hypertension: Secondary | ICD-10-CM | POA: Diagnosis not present

## 2017-10-15 DIAGNOSIS — Z0000 Encounter for general adult medical examination without abnormal findings: Secondary | ICD-10-CM | POA: Diagnosis not present

## 2017-10-15 DIAGNOSIS — M859 Disorder of bone density and structure, unspecified: Secondary | ICD-10-CM | POA: Diagnosis not present

## 2017-10-15 DIAGNOSIS — R82998 Other abnormal findings in urine: Secondary | ICD-10-CM | POA: Diagnosis not present

## 2017-10-15 DIAGNOSIS — E038 Other specified hypothyroidism: Secondary | ICD-10-CM | POA: Diagnosis not present

## 2017-10-15 DIAGNOSIS — R7301 Impaired fasting glucose: Secondary | ICD-10-CM | POA: Diagnosis not present

## 2017-10-22 DIAGNOSIS — H9313 Tinnitus, bilateral: Secondary | ICD-10-CM | POA: Diagnosis not present

## 2017-10-22 DIAGNOSIS — N390 Urinary tract infection, site not specified: Secondary | ICD-10-CM | POA: Diagnosis not present

## 2017-10-22 DIAGNOSIS — N859 Noninflammatory disorder of uterus, unspecified: Secondary | ICD-10-CM | POA: Diagnosis not present

## 2017-10-22 DIAGNOSIS — Z1389 Encounter for screening for other disorder: Secondary | ICD-10-CM | POA: Diagnosis not present

## 2017-10-22 DIAGNOSIS — Z0000 Encounter for general adult medical examination without abnormal findings: Secondary | ICD-10-CM | POA: Diagnosis not present

## 2017-10-22 DIAGNOSIS — Z23 Encounter for immunization: Secondary | ICD-10-CM | POA: Diagnosis not present

## 2017-10-22 DIAGNOSIS — R7301 Impaired fasting glucose: Secondary | ICD-10-CM | POA: Diagnosis not present

## 2017-10-22 DIAGNOSIS — R3121 Asymptomatic microscopic hematuria: Secondary | ICD-10-CM | POA: Diagnosis not present

## 2017-10-26 DIAGNOSIS — Z1212 Encounter for screening for malignant neoplasm of rectum: Secondary | ICD-10-CM | POA: Diagnosis not present

## 2017-11-11 DIAGNOSIS — N83202 Unspecified ovarian cyst, left side: Secondary | ICD-10-CM | POA: Diagnosis not present

## 2018-03-05 DIAGNOSIS — R05 Cough: Secondary | ICD-10-CM | POA: Diagnosis not present

## 2018-03-05 DIAGNOSIS — Z6828 Body mass index (BMI) 28.0-28.9, adult: Secondary | ICD-10-CM | POA: Diagnosis not present

## 2018-03-05 DIAGNOSIS — J181 Lobar pneumonia, unspecified organism: Secondary | ICD-10-CM | POA: Diagnosis not present

## 2018-03-05 DIAGNOSIS — J329 Chronic sinusitis, unspecified: Secondary | ICD-10-CM | POA: Diagnosis not present

## 2018-03-17 DIAGNOSIS — Z6828 Body mass index (BMI) 28.0-28.9, adult: Secondary | ICD-10-CM | POA: Diagnosis not present

## 2018-03-17 DIAGNOSIS — J181 Lobar pneumonia, unspecified organism: Secondary | ICD-10-CM | POA: Diagnosis not present

## 2018-03-17 DIAGNOSIS — I10 Essential (primary) hypertension: Secondary | ICD-10-CM | POA: Diagnosis not present

## 2018-03-17 DIAGNOSIS — R05 Cough: Secondary | ICD-10-CM | POA: Diagnosis not present

## 2018-08-04 DIAGNOSIS — H40013 Open angle with borderline findings, low risk, bilateral: Secondary | ICD-10-CM | POA: Diagnosis not present

## 2018-11-11 DIAGNOSIS — E038 Other specified hypothyroidism: Secondary | ICD-10-CM | POA: Diagnosis not present

## 2018-11-11 DIAGNOSIS — E7849 Other hyperlipidemia: Secondary | ICD-10-CM | POA: Diagnosis not present

## 2018-11-11 DIAGNOSIS — M859 Disorder of bone density and structure, unspecified: Secondary | ICD-10-CM | POA: Diagnosis not present

## 2018-11-11 DIAGNOSIS — I10 Essential (primary) hypertension: Secondary | ICD-10-CM | POA: Diagnosis not present

## 2018-11-11 DIAGNOSIS — R7301 Impaired fasting glucose: Secondary | ICD-10-CM | POA: Diagnosis not present

## 2018-11-18 DIAGNOSIS — R079 Chest pain, unspecified: Secondary | ICD-10-CM | POA: Diagnosis not present

## 2018-11-18 DIAGNOSIS — Z1331 Encounter for screening for depression: Secondary | ICD-10-CM | POA: Diagnosis not present

## 2018-11-18 DIAGNOSIS — M8580 Other specified disorders of bone density and structure, unspecified site: Secondary | ICD-10-CM | POA: Diagnosis not present

## 2018-11-18 DIAGNOSIS — N839 Noninflammatory disorder of ovary, fallopian tube and broad ligament, unspecified: Secondary | ICD-10-CM | POA: Diagnosis not present

## 2018-11-18 DIAGNOSIS — J181 Lobar pneumonia, unspecified organism: Secondary | ICD-10-CM | POA: Diagnosis not present

## 2018-11-18 DIAGNOSIS — J329 Chronic sinusitis, unspecified: Secondary | ICD-10-CM | POA: Diagnosis not present

## 2018-11-18 DIAGNOSIS — Z1339 Encounter for screening examination for other mental health and behavioral disorders: Secondary | ICD-10-CM | POA: Diagnosis not present

## 2018-11-18 DIAGNOSIS — H9319 Tinnitus, unspecified ear: Secondary | ICD-10-CM | POA: Diagnosis not present

## 2018-11-18 DIAGNOSIS — Z0000 Encounter for general adult medical examination without abnormal findings: Secondary | ICD-10-CM | POA: Diagnosis not present

## 2018-11-18 DIAGNOSIS — R7301 Impaired fasting glucose: Secondary | ICD-10-CM | POA: Diagnosis not present

## 2018-11-22 DIAGNOSIS — J181 Lobar pneumonia, unspecified organism: Secondary | ICD-10-CM | POA: Diagnosis not present

## 2018-11-25 ENCOUNTER — Other Ambulatory Visit: Payer: Self-pay | Admitting: Internal Medicine

## 2018-11-25 DIAGNOSIS — G4489 Other headache syndrome: Secondary | ICD-10-CM

## 2018-12-13 ENCOUNTER — Other Ambulatory Visit: Payer: Self-pay

## 2018-12-13 ENCOUNTER — Ambulatory Visit
Admission: RE | Admit: 2018-12-13 | Discharge: 2018-12-13 | Disposition: A | Discharge status: Home / Self Care | Payer: PPO | Source: Ambulatory Visit | Attending: Internal Medicine | Admitting: Internal Medicine

## 2018-12-13 DIAGNOSIS — G4489 Other headache syndrome: Secondary | ICD-10-CM

## 2018-12-13 DIAGNOSIS — J3489 Other specified disorders of nose and nasal sinuses: Secondary | ICD-10-CM | POA: Diagnosis not present

## 2019-02-01 DIAGNOSIS — E038 Other specified hypothyroidism: Secondary | ICD-10-CM | POA: Diagnosis not present

## 2019-02-04 DIAGNOSIS — Z23 Encounter for immunization: Secondary | ICD-10-CM | POA: Diagnosis not present

## 2019-04-29 DIAGNOSIS — Z20828 Contact with and (suspected) exposure to other viral communicable diseases: Secondary | ICD-10-CM | POA: Diagnosis not present

## 2019-04-29 DIAGNOSIS — Z01419 Encounter for gynecological examination (general) (routine) without abnormal findings: Secondary | ICD-10-CM | POA: Diagnosis not present

## 2019-04-29 DIAGNOSIS — Z1231 Encounter for screening mammogram for malignant neoplasm of breast: Secondary | ICD-10-CM | POA: Diagnosis not present

## 2019-05-03 DIAGNOSIS — Z20828 Contact with and (suspected) exposure to other viral communicable diseases: Secondary | ICD-10-CM | POA: Diagnosis not present

## 2019-05-04 DIAGNOSIS — Z9189 Other specified personal risk factors, not elsewhere classified: Secondary | ICD-10-CM | POA: Diagnosis not present

## 2019-05-04 DIAGNOSIS — Z20828 Contact with and (suspected) exposure to other viral communicable diseases: Secondary | ICD-10-CM | POA: Diagnosis not present

## 2019-05-05 DIAGNOSIS — E7849 Other hyperlipidemia: Secondary | ICD-10-CM | POA: Diagnosis not present

## 2019-05-05 DIAGNOSIS — M791 Myalgia, unspecified site: Secondary | ICD-10-CM | POA: Diagnosis not present

## 2019-05-05 DIAGNOSIS — J449 Chronic obstructive pulmonary disease, unspecified: Secondary | ICD-10-CM | POA: Diagnosis not present

## 2019-05-05 DIAGNOSIS — K219 Gastro-esophageal reflux disease without esophagitis: Secondary | ICD-10-CM | POA: Diagnosis not present

## 2019-05-05 DIAGNOSIS — E038 Other specified hypothyroidism: Secondary | ICD-10-CM | POA: Diagnosis not present

## 2019-05-05 DIAGNOSIS — R0602 Shortness of breath: Secondary | ICD-10-CM | POA: Diagnosis not present

## 2019-05-05 DIAGNOSIS — R05 Cough: Secondary | ICD-10-CM | POA: Diagnosis not present

## 2019-05-05 DIAGNOSIS — R5383 Other fatigue: Secondary | ICD-10-CM | POA: Diagnosis not present

## 2019-05-05 DIAGNOSIS — I10 Essential (primary) hypertension: Secondary | ICD-10-CM | POA: Diagnosis not present

## 2019-05-05 DIAGNOSIS — F17201 Nicotine dependence, unspecified, in remission: Secondary | ICD-10-CM | POA: Diagnosis not present

## 2019-05-05 DIAGNOSIS — Z79899 Other long term (current) drug therapy: Secondary | ICD-10-CM | POA: Diagnosis not present

## 2019-05-06 DIAGNOSIS — M791 Myalgia, unspecified site: Secondary | ICD-10-CM | POA: Diagnosis not present

## 2019-05-27 DIAGNOSIS — M79672 Pain in left foot: Secondary | ICD-10-CM | POA: Diagnosis not present

## 2019-05-27 DIAGNOSIS — M2022 Hallux rigidus, left foot: Secondary | ICD-10-CM | POA: Diagnosis not present

## 2019-05-30 ENCOUNTER — Other Ambulatory Visit (HOSPITAL_COMMUNITY): Payer: Self-pay | Admitting: Orthopedic Surgery

## 2019-06-28 DIAGNOSIS — J449 Chronic obstructive pulmonary disease, unspecified: Secondary | ICD-10-CM | POA: Diagnosis not present

## 2019-06-28 DIAGNOSIS — I10 Essential (primary) hypertension: Secondary | ICD-10-CM | POA: Diagnosis not present

## 2019-06-28 DIAGNOSIS — R0602 Shortness of breath: Secondary | ICD-10-CM | POA: Diagnosis not present

## 2019-06-28 DIAGNOSIS — E039 Hypothyroidism, unspecified: Secondary | ICD-10-CM | POA: Diagnosis not present

## 2019-06-28 DIAGNOSIS — F17201 Nicotine dependence, unspecified, in remission: Secondary | ICD-10-CM | POA: Diagnosis not present

## 2019-06-28 DIAGNOSIS — R7301 Impaired fasting glucose: Secondary | ICD-10-CM | POA: Diagnosis not present

## 2019-06-28 DIAGNOSIS — E739 Lactose intolerance, unspecified: Secondary | ICD-10-CM | POA: Diagnosis not present

## 2019-06-28 DIAGNOSIS — E785 Hyperlipidemia, unspecified: Secondary | ICD-10-CM | POA: Diagnosis not present

## 2019-06-28 DIAGNOSIS — K219 Gastro-esophageal reflux disease without esophagitis: Secondary | ICD-10-CM | POA: Diagnosis not present

## 2019-06-29 ENCOUNTER — Other Ambulatory Visit: Payer: Self-pay

## 2019-06-29 ENCOUNTER — Encounter (HOSPITAL_BASED_OUTPATIENT_CLINIC_OR_DEPARTMENT_OTHER): Payer: Self-pay | Admitting: Orthopedic Surgery

## 2019-07-04 ENCOUNTER — Encounter (HOSPITAL_BASED_OUTPATIENT_CLINIC_OR_DEPARTMENT_OTHER)
Admission: RE | Admit: 2019-07-04 | Discharge: 2019-07-04 | Disposition: A | Discharge status: Home / Self Care | Payer: PPO | Source: Ambulatory Visit | Attending: Orthopedic Surgery | Admitting: Orthopedic Surgery

## 2019-07-04 ENCOUNTER — Other Ambulatory Visit (HOSPITAL_COMMUNITY)
Admission: RE | Admit: 2019-07-04 | Discharge: 2019-07-04 | Disposition: A | Discharge status: Home / Self Care | Payer: PPO | Source: Ambulatory Visit | Attending: Orthopedic Surgery | Admitting: Orthopedic Surgery

## 2019-07-04 ENCOUNTER — Other Ambulatory Visit: Payer: Self-pay

## 2019-07-04 DIAGNOSIS — Z01812 Encounter for preprocedural laboratory examination: Secondary | ICD-10-CM | POA: Diagnosis not present

## 2019-07-04 DIAGNOSIS — Z20822 Contact with and (suspected) exposure to covid-19: Secondary | ICD-10-CM | POA: Insufficient documentation

## 2019-07-04 LAB — BASIC METABOLIC PANEL
Anion gap: 10 (ref 5–15)
BUN: 17 mg/dL (ref 8–23)
CO2: 26 mmol/L (ref 22–32)
Calcium: 9.1 mg/dL (ref 8.9–10.3)
Chloride: 103 mmol/L (ref 98–111)
Creatinine, Ser: 0.95 mg/dL (ref 0.44–1.00)
GFR calc Af Amer: >60 mL/min (ref >60)
GFR calc non Af Amer: >60 mL/min (ref >60)
Glucose, Bld: 140 mg/dL — ABNORMAL HIGH (ref 70–99)
Potassium: 3.7 mmol/L (ref 3.5–5.1)
Sodium: 139 mmol/L (ref 135–145)

## 2019-07-04 NOTE — Progress Notes (Signed)

## 2019-07-05 LAB — NOVEL CORONAVIRUS, NAA (HOSP ORDER, SEND-OUT TO REF LAB; TAT 18-24 HRS): SARS-CoV-2, NAA: NOT DETECTED

## 2019-07-07 ENCOUNTER — Encounter (HOSPITAL_BASED_OUTPATIENT_CLINIC_OR_DEPARTMENT_OTHER): Payer: Self-pay | Admitting: Orthopedic Surgery

## 2019-07-07 ENCOUNTER — Other Ambulatory Visit: Payer: Self-pay

## 2019-07-07 ENCOUNTER — Ambulatory Visit (HOSPITAL_BASED_OUTPATIENT_CLINIC_OR_DEPARTMENT_OTHER): Payer: PPO | Admitting: Anesthesiology

## 2019-07-07 ENCOUNTER — Ambulatory Visit (HOSPITAL_BASED_OUTPATIENT_CLINIC_OR_DEPARTMENT_OTHER)
Admission: RE | Admit: 2019-07-07 | Discharge: 2019-07-07 | Disposition: A | Discharge status: Home / Self Care | Payer: PPO | Attending: Orthopedic Surgery | Admitting: Orthopedic Surgery

## 2019-07-07 ENCOUNTER — Encounter (HOSPITAL_BASED_OUTPATIENT_CLINIC_OR_DEPARTMENT_OTHER)
Admission: RE | Disposition: A | Discharge status: Home / Self Care | Payer: Self-pay | Source: Home / Self Care | Attending: Orthopedic Surgery

## 2019-07-07 DIAGNOSIS — Z79899 Other long term (current) drug therapy: Secondary | ICD-10-CM | POA: Insufficient documentation

## 2019-07-07 DIAGNOSIS — E785 Hyperlipidemia, unspecified: Secondary | ICD-10-CM | POA: Insufficient documentation

## 2019-07-07 DIAGNOSIS — I10 Essential (primary) hypertension: Secondary | ICD-10-CM | POA: Diagnosis not present

## 2019-07-07 DIAGNOSIS — T8484XA Pain due to internal orthopedic prosthetic devices, implants and grafts, initial encounter: Secondary | ICD-10-CM | POA: Diagnosis not present

## 2019-07-07 DIAGNOSIS — M21612 Bunion of left foot: Secondary | ICD-10-CM | POA: Diagnosis not present

## 2019-07-07 DIAGNOSIS — J449 Chronic obstructive pulmonary disease, unspecified: Secondary | ICD-10-CM | POA: Diagnosis not present

## 2019-07-07 DIAGNOSIS — Y831 Surgical operation with implant of artificial internal device as the cause of abnormal reaction of the patient, or of later complication, without mention of misadventure at the time of the procedure: Secondary | ICD-10-CM | POA: Insufficient documentation

## 2019-07-07 DIAGNOSIS — K219 Gastro-esophageal reflux disease without esophagitis: Secondary | ICD-10-CM | POA: Diagnosis not present

## 2019-07-07 DIAGNOSIS — Z880 Allergy status to penicillin: Secondary | ICD-10-CM | POA: Diagnosis not present

## 2019-07-07 DIAGNOSIS — E079 Disorder of thyroid, unspecified: Secondary | ICD-10-CM | POA: Insufficient documentation

## 2019-07-07 DIAGNOSIS — M2022 Hallux rigidus, left foot: Secondary | ICD-10-CM | POA: Diagnosis not present

## 2019-07-07 DIAGNOSIS — Z79890 Hormone replacement therapy (postmenopausal): Secondary | ICD-10-CM | POA: Diagnosis not present

## 2019-07-07 DIAGNOSIS — Z87891 Personal history of nicotine dependence: Secondary | ICD-10-CM | POA: Diagnosis not present

## 2019-07-07 HISTORY — PX: HARDWARE REMOVAL: SHX979

## 2019-07-07 HISTORY — DX: Chronic obstructive pulmonary disease, unspecified: J44.9

## 2019-07-07 HISTORY — DX: Other specified postprocedural states: Z98.890

## 2019-07-07 HISTORY — PX: ARTHRODESIS METATARSALPHALANGEAL JOINT (MTPJ): SHX6566

## 2019-07-07 HISTORY — DX: Other specified postprocedural states: R11.2

## 2019-07-07 SURGERY — FUSION, JOINT, GREAT TOE
Anesthesia: General | Site: Toe | Laterality: Left

## 2019-07-07 MED ORDER — MIDAZOLAM HCL 2 MG/2ML IJ SOLN
INTRAMUSCULAR | Status: AC
  Filled 2019-07-07: qty 2

## 2019-07-07 MED ORDER — CEFAZOLIN SODIUM-DEXTROSE 2-4 GM/100ML-% IV SOLN
2.0000 g | INTRAVENOUS | Status: AC
  Administered 2019-07-07: 2 g via INTRAVENOUS

## 2019-07-07 MED ORDER — LIDOCAINE 2% (20 MG/ML) 5 ML SYRINGE
INTRAMUSCULAR | Status: DC | PRN
  Administered 2019-07-07: 100 mg via INTRAVENOUS

## 2019-07-07 MED ORDER — ONDANSETRON HCL 4 MG/2ML IJ SOLN: 4.0000 mg | Freq: Once | INTRAMUSCULAR | Status: DC | PRN

## 2019-07-07 MED ORDER — FENTANYL CITRATE (PF) 100 MCG/2ML IJ SOLN
INTRAMUSCULAR | Status: DC | PRN
  Administered 2019-07-07 (×4): 25 ug via INTRAVENOUS

## 2019-07-07 MED ORDER — ONDANSETRON HCL 4 MG/2ML IJ SOLN
INTRAMUSCULAR | Status: DC | PRN
  Administered 2019-07-07: 4 mg via INTRAVENOUS

## 2019-07-07 MED ORDER — LIDOCAINE 2% (20 MG/ML) 5 ML SYRINGE
INTRAMUSCULAR | Status: AC
  Filled 2019-07-07: qty 5

## 2019-07-07 MED ORDER — SENNA 8.6 MG PO TABS: 2.0000 | ORAL_TABLET | Freq: Two times a day (BID) | ORAL | 0 refills | Status: DC

## 2019-07-07 MED ORDER — EPHEDRINE SULFATE-NACL 50-0.9 MG/10ML-% IV SOSY
PREFILLED_SYRINGE | INTRAVENOUS | Status: DC | PRN
  Administered 2019-07-07: 10 mg via INTRAVENOUS

## 2019-07-07 MED ORDER — MEPERIDINE HCL 25 MG/ML IJ SOLN: 6.2500 mg | INTRAMUSCULAR | Status: DC | PRN

## 2019-07-07 MED ORDER — ACETAMINOPHEN 160 MG/5ML PO SOLN: 325.0000 mg | ORAL | Status: DC | PRN

## 2019-07-07 MED ORDER — FENTANYL CITRATE (PF) 100 MCG/2ML IJ SOLN
INTRAMUSCULAR | Status: AC
  Filled 2019-07-07: qty 2

## 2019-07-07 MED ORDER — BUPIVACAINE HCL (PF) 0.5 % IJ SOLN
INTRAMUSCULAR | Status: AC
  Filled 2019-07-07: qty 30

## 2019-07-07 MED ORDER — SCOPOLAMINE 1 MG/3DAYS TD PT72
MEDICATED_PATCH | TRANSDERMAL | Status: DC | PRN
  Administered 2019-07-07: 1 via TRANSDERMAL

## 2019-07-07 MED ORDER — OXYCODONE HCL 5 MG PO TABS: 5.0000 mg | ORAL_TABLET | Freq: Four times a day (QID) | ORAL | 0 refills | Status: AC | PRN

## 2019-07-07 MED ORDER — CHLORHEXIDINE GLUCONATE 4 % EX LIQD: 60.0000 mL | Freq: Once | CUTANEOUS | Status: DC

## 2019-07-07 MED ORDER — PROPOFOL 10 MG/ML IV BOLUS
INTRAVENOUS | Status: DC | PRN
  Administered 2019-07-07: 100 mg via INTRAVENOUS

## 2019-07-07 MED ORDER — ACETAMINOPHEN 325 MG PO TABS: 325.0000 mg | ORAL_TABLET | ORAL | Status: DC | PRN

## 2019-07-07 MED ORDER — SCOPOLAMINE 1 MG/3DAYS TD PT72
MEDICATED_PATCH | TRANSDERMAL | Status: AC
  Filled 2019-07-07: qty 1

## 2019-07-07 MED ORDER — ONDANSETRON HCL 4 MG/2ML IJ SOLN
INTRAMUSCULAR | Status: AC
  Filled 2019-07-07: qty 2

## 2019-07-07 MED ORDER — BUPIVACAINE LIPOSOME 1.3 % IJ SUSP
INTRAMUSCULAR | Status: DC | PRN
  Administered 2019-07-07: 46 mL

## 2019-07-07 MED ORDER — KETOROLAC TROMETHAMINE 15 MG/ML IJ SOLN: 15.0000 mg | Freq: Once | INTRAMUSCULAR | Status: DC

## 2019-07-07 MED ORDER — MIDAZOLAM HCL 5 MG/5ML IJ SOLN
INTRAMUSCULAR | Status: DC | PRN
  Administered 2019-07-07: 2 mg via INTRAVENOUS

## 2019-07-07 MED ORDER — SODIUM CHLORIDE 0.9 % IV SOLN: INTRAVENOUS | Status: DC

## 2019-07-07 MED ORDER — DOCUSATE SODIUM 100 MG PO CAPS: 100.0000 mg | ORAL_CAPSULE | Freq: Every day | ORAL | 2 refills | Status: DC | PRN

## 2019-07-07 MED ORDER — LIDOCAINE-EPINEPHRINE 1 %-1:100000 IJ SOLN
INTRAMUSCULAR | Status: AC
  Filled 2019-07-07: qty 1

## 2019-07-07 MED ORDER — DEXAMETHASONE SODIUM PHOSPHATE 10 MG/ML IJ SOLN
INTRAMUSCULAR | Status: AC
  Filled 2019-07-07: qty 1

## 2019-07-07 MED ORDER — OXYCODONE HCL 5 MG/5ML PO SOLN: 5.0000 mg | Freq: Once | ORAL | Status: DC | PRN

## 2019-07-07 MED ORDER — BUPIVACAINE LIPOSOME 1.3 % IJ SUSP
INTRAMUSCULAR | Status: AC
  Filled 2019-07-07: qty 20

## 2019-07-07 MED ORDER — 0.9 % SODIUM CHLORIDE (POUR BTL) OPTIME
TOPICAL | Status: DC | PRN
  Administered 2019-07-07: 120 mL

## 2019-07-07 MED ORDER — FENTANYL CITRATE (PF) 100 MCG/2ML IJ SOLN: 50.0000 ug | INTRAMUSCULAR | Status: DC | PRN

## 2019-07-07 MED ORDER — EPHEDRINE 5 MG/ML INJ
INTRAVENOUS | Status: AC
  Filled 2019-07-07: qty 10

## 2019-07-07 MED ORDER — LACTATED RINGERS IV SOLN: INTRAVENOUS | Status: DC

## 2019-07-07 MED ORDER — DEXAMETHASONE SODIUM PHOSPHATE 10 MG/ML IJ SOLN
INTRAMUSCULAR | Status: DC | PRN
  Administered 2019-07-07: 10 mg via INTRAVENOUS

## 2019-07-07 MED ORDER — OXYCODONE HCL 5 MG PO TABS: 5.0000 mg | ORAL_TABLET | Freq: Once | ORAL | Status: DC | PRN

## 2019-07-07 MED ORDER — FENTANYL CITRATE (PF) 100 MCG/2ML IJ SOLN: 25.0000 ug | INTRAMUSCULAR | Status: DC | PRN

## 2019-07-07 MED ORDER — MIDAZOLAM HCL 2 MG/2ML IJ SOLN: 1.0000 mg | INTRAMUSCULAR | Status: DC | PRN

## 2019-07-07 SURGICAL SUPPLY — 84 items
BANDAGE ESMARK 6X9 LF (GAUZE/BANDAGES/DRESSINGS) IMPLANT
BIT DRILL 2.7XCANN QCK CNCT (BIT) ×2 IMPLANT
BIT DRILL CANN 2.7 (BIT) ×1
BIT DRILL Q-C 2.0 DIA 100 (BIT) ×3 IMPLANT
BIT DRL 2.7XCANN QCK CNCT (BIT) ×2
BLADE AVERAGE 25X9 (BLADE) IMPLANT
BLADE MICRO SAGITTAL (BLADE) IMPLANT
BLADE OSC/SAG .038X5.5 CUT EDG (BLADE) IMPLANT
BLADE SURG 15 STRL LF DISP TIS (BLADE) ×6 IMPLANT
BLADE SURG 15 STRL SS (BLADE) ×3
BNDG COHESIVE 4X5 TAN STRL (GAUZE/BANDAGES/DRESSINGS) IMPLANT
BNDG COHESIVE 6X5 TAN STRL LF (GAUZE/BANDAGES/DRESSINGS) IMPLANT
BNDG CONFORM 3 STRL LF (GAUZE/BANDAGES/DRESSINGS) ×3 IMPLANT
BNDG ELASTIC 4X5.8 VLCR STR LF (GAUZE/BANDAGES/DRESSINGS) ×3 IMPLANT
BNDG ESMARK 6X9 LF (GAUZE/BANDAGES/DRESSINGS)
BOOT STEPPER DURA LG (SOFTGOODS) IMPLANT
BOOT STEPPER DURA MED (SOFTGOODS) ×3 IMPLANT
BOOT STEPPER DURA SM (SOFTGOODS) IMPLANT
BOOT STEPPER DURA XLG (SOFTGOODS) IMPLANT
CHLORAPREP W/TINT 26 (MISCELLANEOUS) ×3 IMPLANT
COVER BACK TABLE 60X90IN (DRAPES) ×3 IMPLANT
COVER WAND RF STERILE (DRAPES) IMPLANT
CUFF TOURN SGL QUICK 34 (TOURNIQUET CUFF) ×1
CUFF TRNQT CYL 34X4.125X (TOURNIQUET CUFF) ×2 IMPLANT
DRAPE EXTREMITY T 121X128X90 (DISPOSABLE) ×3 IMPLANT
DRAPE HALF SHEET 70X43 (DRAPES) ×6 IMPLANT
DRAPE OEC MINIVIEW 54X84 (DRAPES) ×3 IMPLANT
DRAPE U-SHAPE 47X51 STRL (DRAPES) ×3 IMPLANT
DRSG MEPITEL 4X7.2 (GAUZE/BANDAGES/DRESSINGS) ×3 IMPLANT
DRSG PAD ABDOMINAL 8X10 ST (GAUZE/BANDAGES/DRESSINGS) ×3 IMPLANT
ELECT REM PT RETURN 9FT ADLT (ELECTROSURGICAL) ×3
ELECTRODE REM PT RTRN 9FT ADLT (ELECTROSURGICAL) ×2 IMPLANT
GAUZE SPONGE 4X4 12PLY STRL (GAUZE/BANDAGES/DRESSINGS) ×3 IMPLANT
GLOVE BIO SURGEON STRL SZ 6 (GLOVE) ×3 IMPLANT
GLOVE BIO SURGEON STRL SZ7 (GLOVE) ×3 IMPLANT
GLOVE BIO SURGEON STRL SZ8 (GLOVE) ×3 IMPLANT
GLOVE BIOGEL PI IND STRL 6.5 (GLOVE) ×2 IMPLANT
GLOVE BIOGEL PI IND STRL 7.0 (GLOVE) ×2 IMPLANT
GLOVE BIOGEL PI IND STRL 7.5 (GLOVE) ×2 IMPLANT
GLOVE BIOGEL PI IND STRL 8 (GLOVE) ×6 IMPLANT
GLOVE BIOGEL PI INDICATOR 6.5 (GLOVE) ×1
GLOVE BIOGEL PI INDICATOR 7.0 (GLOVE) ×1
GLOVE BIOGEL PI INDICATOR 7.5 (GLOVE) ×1
GLOVE BIOGEL PI INDICATOR 8 (GLOVE) ×3
GLOVE ECLIPSE 8.0 STRL XLNG CF (GLOVE) ×3 IMPLANT
GOWN STRL REUS W/ TWL LRG LVL3 (GOWN DISPOSABLE) ×2 IMPLANT
GOWN STRL REUS W/ TWL XL LVL3 (GOWN DISPOSABLE) ×6 IMPLANT
GOWN STRL REUS W/TWL LRG LVL3 (GOWN DISPOSABLE) ×1
GOWN STRL REUS W/TWL XL LVL3 (GOWN DISPOSABLE) ×3
K-WIRE ACE 1.6X6 (WIRE) ×3
KWIRE ACE 1.6X6 (WIRE) ×2 IMPLANT
NEEDLE HYPO 22GX1.5 SAFETY (NEEDLE) ×3 IMPLANT
PACK BASIN DAY SURGERY FS (CUSTOM PROCEDURE TRAY) ×3 IMPLANT
PAD CAST 4YDX4 CTTN HI CHSV (CAST SUPPLIES) ×2 IMPLANT
PADDING CAST ABS 4INX4YD NS (CAST SUPPLIES)
PADDING CAST ABS COTTON 4X4 ST (CAST SUPPLIES) IMPLANT
PADDING CAST COTTON 4X4 STRL (CAST SUPPLIES) ×1
PADDING CAST COTTON 6X4 STRL (CAST SUPPLIES) IMPLANT
PENCIL SMOKE EVACUATOR (MISCELLANEOUS) ×3 IMPLANT
PLATE TUBULAR 31 4H (Plate) ×3 IMPLANT
SANITIZER HAND PURELL 535ML FO (MISCELLANEOUS) ×3 IMPLANT
SCREW CANN 1/2THREAD 4.0X32 (Screw) ×3 IMPLANT
SCREW CORT 2.5X20X2.7XST SM (Screw) ×6 IMPLANT
SCREW CORTICAL 2.7X18MM (Screw) ×3 IMPLANT
SCREW CORTICAL 2.7X20MM (Screw) ×3 IMPLANT
SLEEVE SCD COMPRESS KNEE MED (MISCELLANEOUS) ×3 IMPLANT
SPLINT FAST PLASTER 5X30 (CAST SUPPLIES)
SPLINT PLASTER CAST FAST 5X30 (CAST SUPPLIES) IMPLANT
SPONGE LAP 18X18 RF (DISPOSABLE) ×3 IMPLANT
SPONGE SURGIFOAM ABS GEL 12-7 (HEMOSTASIS) IMPLANT
STOCKINETTE 6  STRL (DRAPES) ×1
STOCKINETTE 6 STRL (DRAPES) ×2 IMPLANT
SUCTION FRAZIER HANDLE 10FR (MISCELLANEOUS) ×1
SUCTION TUBE FRAZIER 10FR DISP (MISCELLANEOUS) ×2 IMPLANT
SUT ETHILON 3 0 PS 1 (SUTURE) ×3 IMPLANT
SUT MNCRL AB 3-0 PS2 18 (SUTURE) ×3 IMPLANT
SUT VIC AB 0 SH 27 (SUTURE) IMPLANT
SUT VIC AB 2-0 SH 27 (SUTURE) ×1
SUT VIC AB 2-0 SH 27XBRD (SUTURE) ×2 IMPLANT
SYR BULB 3OZ (MISCELLANEOUS) ×3 IMPLANT
SYR CONTROL 10ML LL (SYRINGE) IMPLANT
TOWEL GREEN STERILE FF (TOWEL DISPOSABLE) ×6 IMPLANT
TUBE CONNECTING 20X1/4 (TUBING) ×3 IMPLANT
UNDERPAD 30X36 HEAVY ABSORB (UNDERPADS AND DIAPERS) ×3 IMPLANT

## 2019-07-07 NOTE — Op Note (Signed)
07/07/2019  11:19 AM  PATIENT:  Jennifer Bradley  64 y.o. female  PRE-OPERATIVE DIAGNOSIS: 1.  Left hallux rigidus      2.  Painful hardware left 1st MT  POST-OPERATIVE DIAGNOSIS:  Same  Procedure(s): 1.  Left hallux metatarsal phalangeal joint arthrodesis 2.  Hardware removal left foot 3.  AP and lateral xrays of the left foot  SURGEON:  Wylene Simmer, MD  ASSISTANT: Mechele Claude, PA-C  ANESTHESIA:   General, regional  EBL:  minimal   TOURNIQUET:   Total Tourniquet Time Documented: Thigh (Left) - 43 minutes Total: Thigh (Left) - 43 minutes  COMPLICATIONS:  None apparent  DISPOSITION:  Extubated, awake and stable to recovery.  INDICATION FOR PROCEDURE: The patient is a 64 year old female with a long history of left forefoot pain due to hallux rigidus and a bunion deformity.  She has failed nonoperative treatment to date and presents today for removal of hardware and hallux MP joint arthrodesis.  The risks and benefits of the alternative treatment options have been discussed in detail.  The patient wishes to proceed with surgery and specifically understands risks of bleeding, infection, nerve damage, blood clots, need for additional surgery, amputation and death.  PROCEDURE IN DETAIL:  After pre operative consent was obtained, and the correct operative site was identified, the patient was brought to the operating room and placed supine on the OR table.  Anesthesia was administered.  Pre-operative antibiotics were administered.  A surgical timeout was taken.  The left lower extremity was prepped and draped in standard sterile fashion with a tourniquet around the thigh.  The extremity was elevated and the tourniquet was inflated to 250 mmHg.  A longitudinal incision was made over the hallux MP joint.  Dissection was carried down through the subcutaneous tissues.  The extensor houses longus and brevis tendons were protected.  The dorsal joint capsule was incised and elevated medially and  laterally.  The collateral ligaments were released.  The head of the metatarsal was exposed.  The retained screw at the head of the metatarsal was identified.  The screw head was cleared of all overgrown bone and soft tissue.  The screw was then removed in its entirety.  A K wire was inserted centrally in the metatarsal head.  A concave reamer was used to remove the remaining articular cartilage and subchondral bone.  The K wire was then placed in the center of the base of the proximal phalanx.  A convex reamer was used to remove the remaining articular cartilage and subchondral bone.  The wound was irrigated.  The joint was reduced and provisionally pinned.  AP and lateral radiographs confirmed appropriate position of the hallux MP joint and the K wire.  A stab incision was made and the K wire was measured.  A 4 mm partially-threaded stainless steel cannulated screw from the [employer] set was inserted.  It was noted to compress the arthrodesis site appropriately.  A 4 hole one quarter tubular plate was then contoured to fit the dorsum of the joint.  It was secured proximally and distally with 2 bicortical screws on each side of the joint.  Final AP and lateral radiographs confirmed appropriate position and length of all hardware and appropriate reduction of the hallux MP joint.  The wound was irrigated copiously.  Deep subcutaneous tissues were approximated with 2-0 Vicryl.  Skin incision was closed with 3-0 nylon.  Sterile dressings were applied followed by a well-padded compression dressing.  Tourniquet was released after application  of the dressings.  The patient was placed in a cam boot.  She was awakened from anesthesia and transported to the recovery room in stable condition.  Subcutaneous tissues were infiltrated with Exparel after closing the wound for postoperative pain control.  AP and lateral radiographs confirmed appropriate position and length of all hardware and appropriate arthrodesis of the  hallux MP joint.   FOLLOW UP PLAN: Weightbearing as tolerated on her heel in a cam boot.  Follow-up in the office in 2 weeks for suture removal.  Plan weightbearing immobilization in the cam boot for 6 weeks postop.   RADIOGRAPHS: AP and lateral radiographs of the left foot are obtained intraoperatively.  These show interval arthrodesis of the hallux MP joint and removal of hardware from the first metatarsal head.  No other acute injuries are noted.    Alfredo Martinez PA-C was present and scrubbed for the duration of the operative case. His assistance was essential in positioning the patient, prepping and draping, gaining and maintaining exposure, performing the operation, closing and dressing the wounds and applying the splint.

## 2019-07-07 NOTE — Anesthesia Preprocedure Evaluation (Signed)
Anesthesia Evaluation  Patient identified by MRN, date of birth, ID band Patient awake    Reviewed: Allergy & Precautions, NPO status , Patient's Chart, lab work & pertinent test results, reviewed documented beta blocker date and time   History of Anesthesia Complications (+) PONV and history of anesthetic complications  Airway Mallampati: I       Dental no notable dental hx. (+) Teeth Intact   Pulmonary COPD, former smoker,    Pulmonary exam normal breath sounds clear to auscultation       Cardiovascular hypertension, Pt. on medications and Pt. on home beta blockers Normal cardiovascular exam Rhythm:Regular Rate:Normal     Neuro/Psych negative psych ROS   GI/Hepatic Neg liver ROS, GERD  Medicated and Controlled,  Endo/Other  negative endocrine ROS  Renal/GU negative Renal ROS  negative genitourinary   Musculoskeletal   Abdominal Normal abdominal exam  (+)   Peds  Hematology   Anesthesia Other Findings   Reproductive/Obstetrics                             Anesthesia Physical Anesthesia Plan  ASA: II  Anesthesia Plan: General   Post-op Pain Management:    Induction: Intravenous  PONV Risk Score and Plan: 4 or greater and Ondansetron, Dexamethasone and Midazolam  Airway Management Planned: LMA  Additional Equipment: None  Intra-op Plan:   Post-operative Plan: Extubation in OR  Informed Consent: I have reviewed the patients History and Physical, chart, labs and discussed the procedure including the risks, benefits and alternatives for the proposed anesthesia with the patient or authorized representative who has indicated his/her understanding and acceptance.     Dental advisory given  Plan Discussed with: CRNA  Anesthesia Plan Comments:         Anesthesia Quick Evaluation

## 2019-07-07 NOTE — H&P (Signed)
Jennifer Bradley is an 64 y.o. female.   Chief Complaint: Left foot pain HPI: The patient is a 64 year old female without significant past medical history.  She has a long history of left forefoot pain due to hallux rigidus and a bunion deformity.  She has failed nonoperative treatment including activity modification, oral anti-inflammatories and shoewear modification.  She presents today for surgical treatment of this painful and limiting condition.  Past Medical History:  Diagnosis Date  . Allergy   . Cataract   . COPD (chronic obstructive pulmonary disease) (HCC)   . GERD (gastroesophageal reflux disease)   . Hyperlipidemia   . Hypertension   . PONV (postoperative nausea and vomiting)   . Thyroid disease     Past Surgical History:  Procedure Laterality Date  . APPENDECTOMY    . BLADDER SUSPENSION    . broken wrist Left   . BUNIONECTOMY Left   . CHOLECYSTECTOMY    . THYROIDECTOMY      Family History  Problem Relation Age of Onset  . Colon cancer Neg Hx   . Esophageal cancer Neg Hx   . Rectal cancer Neg Hx   . Stomach cancer Neg Hx    Social History:  reports that she quit smoking about 2 years ago. Her smoking use included cigarettes. She has never used smokeless tobacco. She reports current alcohol use. She reports that she does not use drugs.  Allergies:  Allergies  Allergen Reactions  . Penicillins Rash    Facility-Administered Medications Prior to Admission  Medication Dose Route Frequency Provider Last Rate Last Admin  . 0.9 %  sodium chloride infusion  500 mL Intravenous Continuous Charlie Pitter III, MD       Medications Prior to Admission  Medication Sig Dispense Refill  . hydrochlorothiazide (HYDRODIURIL) 25 MG tablet TK 1 T PO QD  3  . metoprolol (LOPRESSOR) 100 MG tablet TK 1 T PO QD  1  . olmesartan (BENICAR) 20 MG tablet Take 20 mg by mouth daily.    . pantoprazole (PROTONIX) 40 MG tablet 40 mg daily.  11  . rosuvastatin (CRESTOR) 20 MG tablet TK 1 T PO QD   11  . SYNTHROID 137 MCG tablet TK 1 T PO QD  0    No results found for this or any previous visit (from the past 48 hour(s)). No results found.  Review of Systems no recent fever, chills, nausea, vomiting or changes in her appetite Blood pressure 138/81, pulse 66, temperature (!) 96.2 F (35.7 C), temperature source Temporal, resp. rate 16, height 5\' 10"  (1.778 m), weight 95.5 kg, SpO2 99 %. Physical Exam  Well-nourished well-developed woman in no apparent distress.  Alert and oriented x4.  Mood and affect are normal.  Extraocular motions are intact.  Respirations are unlabored.  Gait is normal.  The left hallux has decreased range of motion.  Tender to palpation at the MP joint.  There is a prominent bunion deformity.  Pulses are palpable in the foot.  Sensibility to light touch is intact dorsally and plantarly at the forefoot.   Assessment/Plan Left hallux rigidus and bunion deformity -to the operating room today for hallux MP joint arthrodesis.  The risks and benefits of the alternative treatment options have been discussed in detail.  The patient wishes to proceed with surgery and specifically understands risks of bleeding, infection, nerve damage, blood clots, need for additional surgery, amputation and death.   , MD 07/10/2019, 10:04 AM

## 2019-07-07 NOTE — Anesthesia Postprocedure Evaluation (Signed)
Anesthesia Post Note  Patient: Jennifer Bradley  Procedure(s) Performed: Left hallux metatarsal phalangeal joint arthrodesis (Left Toe) Hardware removal left foot (Left Foot)     Patient location during evaluation: Phase II Anesthesia Type: General Level of consciousness: awake Pain management: pain level controlled Vital Signs Assessment: post-procedure vital signs reviewed and stable Respiratory status: spontaneous breathing Cardiovascular status: stable Postop Assessment: no apparent nausea or vomiting Anesthetic complications: no    Last Vitals:  Vitals:   07/07/19 1215 07/07/19 1300  BP: 119/73 139/75  Pulse: 64 66  Resp: 12 16  Temp:  36.5 C  SpO2: 98% 100%    Last Pain:  Vitals:   07/07/19 1300  TempSrc:   PainSc: 0-No pain   Pain Goal: Patients Stated Pain Goal: 7 (07/07/19 0839)                 Caren Macadam

## 2019-07-07 NOTE — Transfer of Care (Signed)
Immediate Anesthesia Transfer of Care Note  Patient: Jennifer Bradley  Procedure(s) Performed: Left hallux metatarsal phalangeal joint arthrodesis (Left Toe) Hardware removal left foot (Left Foot)  Patient Location: PACU  Anesthesia Type:General  Level of Consciousness: sedated  Airway & Oxygen Therapy: Patient Spontanous Breathing and Patient connected to face mask oxygen  Post-op Assessment: Report given to RN and Post -op Vital signs reviewed and stable  Post vital signs: Reviewed and stable  Last Vitals:  Vitals Value Taken Time  BP 112/67 07/07/19 1120  Temp    Pulse 76 07/07/19 1122  Resp 14 07/07/19 1122  SpO2 100 % 07/07/19 1122  Vitals shown include unvalidated device data.  Last Pain:  Vitals:   07/07/19 0839  TempSrc: Temporal  PainSc: 0-No pain      Patients Stated Pain Goal: 7 (07/07/19 0839)  Complications: No apparent anesthesia complications

## 2019-07-07 NOTE — Anesthesia Procedure Notes (Signed)
Procedure Name: LMA Insertion Date/Time: 07/07/2019 10:17 AM Performed by: Burna Cash, CRNA Pre-anesthesia Checklist: Patient identified, Emergency Drugs available, Suction available and Patient being monitored Patient Re-evaluated:Patient Re-evaluated prior to induction Oxygen Delivery Method: Circle system utilized Preoxygenation: Pre-oxygenation with 100% oxygen Induction Type: IV induction Ventilation: Mask ventilation without difficulty LMA: LMA inserted LMA Size: 4.0 Number of attempts: 1 Airway Equipment and Method: Bite block Placement Confirmation: positive ETCO2 Tube secured with: Tape Dental Injury: Teeth and Oropharynx as per pre-operative assessment

## 2019-07-07 NOTE — Discharge Instructions (Addendum)
Toni Arthurs, MD [facility]  Please read the following information regarding your care after surgery.  Medications  You only need a prescription for the narcotic pain medicine (ex. oxycodone, Percocet, Norco).  All of the other medicines listed below are available over the counter. X Aleve 2 pills twice a day for the first 3 days after surgery. X acetominophen (Tylenol) 650 mg every 4-6 hours as you need for minor to moderate pain X oxycodone as prescribed for severe pain  Narcotic pain medicine (ex. oxycodone, Percocet, Vicodin) will cause constipation.  To prevent this problem, take the following medicines while you are taking any pain medicine. X docusate sodium (Colace) 100 mg twice a day X senna (Senokot) 2 tablets twice a day  Weight Bearing X Bear weight only on your operated foot in the tall CAM boot.   Cast / Splint / Dressing X Keep your splint, cast or dressing clean and dry.  Dont put anything (coat hanger, pencil, etc) down inside of it.  If it gets damp, use a hair dryer on the cool setting to dry it.  If it gets soaked, call the office to schedule an appointment for a cast change.   After your dressing, cast or splint is removed; you may shower, but do not soak or scrub the wound.  Allow the water to run over it, and then gently pat it dry.  Swelling It is normal for you to have swelling where you had surgery.  To reduce swelling and pain, keep your toes above your nose for at least 3 days after surgery.  It may be necessary to keep your foot or leg elevated for several weeks.  If it hurts, it should be elevated.  Follow Up Call my office at [phone] when you are discharged from the hospital or surgery center to schedule an appointment to be seen two weeks after surgery.  Call my office at [phone] if you develop a fever >101.5 F, nausea, vomiting, bleeding from the surgical site or severe pain.     Post Anesthesia Home Care Instructions  Activity: Get  plenty of rest for the remainder of the day. A responsible individual must stay with you for 24 hours following the procedure.  For the next 24 hours, DO NOT: -Drive a car -Advertising copywriter -Drink alcoholic beverages -Take any medication unless instructed by your physician -Make any legal decisions or sign important papers.  Meals: Start with liquid foods such as gelatin or soup. Progress to regular foods as tolerated. Avoid greasy, spicy, heavy foods. If nausea and/or vomiting occur, drink only clear liquids until the nausea and/or vomiting subsides. Call your physician if vomiting continues.  Special Instructions/Symptoms: Your throat may feel dry or sore from the anesthesia or the breathing tube placed in your throat during surgery. If this causes discomfort, gargle with warm salt water. The discomfort should disappear within 24 hours.  If you had a scopolamine patch placed behind your ear for the management of post- operative nausea and/or vomiting:  1. The medication in the patch is effective for 72 hours, after which it should be removed.  Wrap patch in a tissue and discard in the trash. Wash hands thoroughly with soap and water. 2. You may remove the patch earlier than 72 hours if you experience unpleasant side effects which may include dry mouth, dizziness or visual disturbances. 3. Avoid touching the patch. Wash your hands with soap and water after contact with the patch.

## 2019-07-08 ENCOUNTER — Encounter: Payer: Self-pay | Admitting: *Deleted

## 2019-07-12 ENCOUNTER — Institutional Professional Consult (permissible substitution): Payer: PPO | Admitting: Internal Medicine

## 2019-07-13 ENCOUNTER — Ambulatory Visit: Payer: PPO

## 2019-07-14 ENCOUNTER — Institutional Professional Consult (permissible substitution): Payer: PPO | Admitting: Internal Medicine

## 2019-07-15 ENCOUNTER — Institutional Professional Consult (permissible substitution): Payer: PPO | Admitting: Pulmonary Disease

## 2019-07-19 DIAGNOSIS — N390 Urinary tract infection, site not specified: Secondary | ICD-10-CM | POA: Diagnosis not present

## 2019-07-22 ENCOUNTER — Ambulatory Visit: Payer: PPO | Attending: Internal Medicine

## 2019-07-22 DIAGNOSIS — Z23 Encounter for immunization: Secondary | ICD-10-CM

## 2019-07-22 NOTE — Progress Notes (Signed)
   Covid-19 Vaccination Clinic  Name:  Jennifer Bradley    MRN: [id number] DOB: 14-Mar-1956  07/22/2019  Jennifer Bradley was observed post Covid-19 immunization for 15 minutes without incidence. She was provided with Vaccine Information Sheet and instruction to access the V-Safe system.   Jennifer Bradley was instructed to call 911 with any severe reactions post vaccine: Marland Kitchen Difficulty breathing  . Swelling of your face and throat  . A fast heartbeat  . A bad rash all over your body  . Dizziness and weakness    Immunizations Administered    Name Date Dose VIS Date Route   Pfizer COVID-19 Vaccine 07/22/2019 10:52 AM 0.3 mL 05/27/2019 Intramuscular   Manufacturer: [employer], [employer]   Lot: [id number]   NDC: [address]-7

## 2019-07-25 ENCOUNTER — Other Ambulatory Visit: Payer: Self-pay

## 2019-07-25 ENCOUNTER — Encounter: Payer: Self-pay | Admitting: Pulmonary Disease

## 2019-07-25 ENCOUNTER — Ambulatory Visit: Payer: PPO | Admitting: Pulmonary Disease

## 2019-07-25 VITALS — BP 124/78 | HR 67 | Temp 97.6°F | Ht 70.0 in | Wt 208.0 lb

## 2019-07-25 DIAGNOSIS — Z87891 Personal history of nicotine dependence: Secondary | ICD-10-CM

## 2019-07-25 DIAGNOSIS — I10 Essential (primary) hypertension: Secondary | ICD-10-CM | POA: Insufficient documentation

## 2019-07-25 DIAGNOSIS — R0602 Shortness of breath: Secondary | ICD-10-CM

## 2019-07-25 DIAGNOSIS — K219 Gastro-esophageal reflux disease without esophagitis: Secondary | ICD-10-CM | POA: Insufficient documentation

## 2019-07-25 DIAGNOSIS — E785 Hyperlipidemia, unspecified: Secondary | ICD-10-CM | POA: Insufficient documentation

## 2019-07-25 MED ORDER — ALBUTEROL SULFATE HFA 108 (90 BASE) MCG/ACT IN AERS: 2.0000 | INHALATION_SPRAY | Freq: Four times a day (QID) | RESPIRATORY_TRACT | 5 refills | Status: DC | PRN

## 2019-07-25 MED ORDER — SPIRIVA RESPIMAT 2.5 MCG/ACT IN AERS: 2.0000 | INHALATION_SPRAY | Freq: Every day | RESPIRATORY_TRACT | 0 refills | Status: DC

## 2019-07-25 NOTE — Progress Notes (Signed)
Subjective:   PATIENT ID: Jennifer Bradley GENDER: female DOB: 02-27-1956, MRN: [id number]   HPI  Chief Complaint  Patient presents with  . Consult    Referred by PCP for intermittent SOB Xfew months.      Reason for Visit: New consult for shortness of breath  [name] is a 64 year old female former smoker HTN, HLD, hypothyroidism and reflux who presents as a referral from her PCP for shortness of breath.  She was referred by Dr. Crist Infante however no records available at time of visit. Per patient, she began having gradually progressive shortness of breath that began 6 months ago. She had chest x-ray concerning for hyperinflation for possible COPD.  She reports that her shortness of breath has been unchanged and associated with chest tightness. No wheezing. Improved with albuterol which she started two months ago. Symptoms worsen with exertion. She is no longer able to do heavy activity like moving furniture. She is still able to walk upstairs without stopping but feels very short of breath. She is not able to play with her grandkids.  Social History: Former smoker. 3/4 pack per day at work x 40 years. Last cigarette 2 years ago.   Environmental exposures: None  I have personally reviewed patient's past medical/family/social history, allergies, current medications.  Past Medical History:  Diagnosis Date  . Allergy   . Cataract   . COPD (chronic obstructive pulmonary disease) ([place])   . GERD (gastroesophageal reflux disease)   . Hyperlipidemia   . Hypertension   . PONV (postoperative nausea and vomiting)   . Thyroid disease      Family History  Problem Relation Age of Onset  . Colon cancer Neg Hx   . Esophageal cancer Neg Hx   . Rectal cancer Neg Hx   . Stomach cancer Neg Hx      Social History   Occupational History  . Not on file  Tobacco Use  . Smoking status: Former Smoker    Packs/day: 0.75    Years: 20.00    Pack years: 15.00    Types: Cigarettes    Quit  date: 2019    Years since quitting: 2.1  . Smokeless tobacco: Never Used  Substance and Sexual Activity  . Alcohol use: Yes    Comment: occasional wine  . Drug use: No  . Sexual activity: Not on file    Allergies  Allergen Reactions  . Penicillins Rash     Outpatient Medications Prior to Visit  Medication Sig Dispense Refill  . hydrochlorothiazide (HYDRODIURIL) 25 MG tablet TK 1 T PO QD  3  . metoprolol (LOPRESSOR) 100 MG tablet TK 1 T PO QD  1  . olmesartan (BENICAR) 20 MG tablet Take 20 mg by mouth daily.    . pantoprazole (PROTONIX) 40 MG tablet 40 mg daily.  11  . rosuvastatin (CRESTOR) 20 MG tablet TK 1 T PO QD  11  . SYNTHROID 137 MCG tablet TK 1 T PO QD  0  . docusate sodium (COLACE) 100 MG capsule Take 1 capsule (100 mg total) by mouth daily as needed. 30 capsule 2  . senna (SENOKOT) 8.6 MG TABS tablet Take 2 tablets (17.2 mg total) by mouth 2 (two) times daily. 30 tablet 0   No facility-administered medications prior to visit.    Review of Systems  Constitutional: Negative for chills, diaphoresis, fever, malaise/fatigue and weight loss.  HENT: Negative for congestion.   Respiratory: Positive for cough and  shortness of breath. Negative for hemoptysis, sputum production and wheezing.   Cardiovascular: Negative for chest pain (chest tightness), palpitations and leg swelling.    Objective:   Vitals:   07/25/19 1036  BP: 124/78  Pulse: 67  Temp: 97.6 F (36.4 C)  TempSrc: Temporal  SpO2: 98%  Weight: 208 lb (94.3 kg)  Height: 5\' 10"  (1.778 m)   SpO2: 98 % O2 Device: None (Room air)  Physical Exam: General: Well-appearing, no acute distress HENT: [place], AT Eyes: EOMI, no scleral icterus Respiratory: Clear to auscultation bilaterally.  No crackles, wheezing or rales Cardiovascular: RRR, -M/R/G, no JVD Extremities:-Edema,-tenderness. Left boot Neuro: AAO x4, CNII-XII grossly intact Skin: Intact, no rashes or bruising Psych: Normal mood, normal affect  Data  Reviewed:  Imaging: CT Maxillofacial WO contrast 12/13/18 - minimal sinus mucosal thickening without air-fluid level  PFT: None on file     Assessment & Plan:   Discussion: 63 year old female former smoker with shortness of breath x 6 months. CXR reportedly with hyperinflated lungs. Based on clinical presentation, will pursue evaluation with pulmonary function test to rule out obstructive lung disease.  Shortness of breath START Spiriva 2.5 mcg TWO puffs ONCE a day. This is your EVERYDAY inhaler. CONTINUE Albuterol AS NEEDED for shortness of breath. This is your RESCUE inhaler. Will arrange for pulmonary function test (Before and after only). Prior to test please hold your inhalers for 48 hours for accurate testing.  Follow-up with me or NP after pulmonary function test.  Health Maintenance Immunization History  Administered Date(s) Administered  . Influenza, High Dose Seasonal PF 02/22/2019  . PFIZER SARS-COV-2 Vaccination 07/22/2019   CT Lung Screen - not qualified  Orders Placed This Encounter  Procedures  . Pulmonary function test    Standing Status:   Future    Standing Expiration Date:   07/24/2020    Order Specific Question:   Where should this test be performed?    Answer:    Pulmonary    Order Specific Question:   Full PFT: includes the following: basic spirometry, spirometry pre & post bronchodilator, diffusion capacity (DLCO), lung volumes    Answer:   Full PFT    Order Specific Question:   MIP/MEP    Answer:   No    Order Specific Question:   6 minute walk    Answer:   No    Order Specific Question:   ABG    Answer:   No    Order Specific Question:   Diffusion capacity (DLCO)    Answer:   Yes    Order Specific Question:   Lung volumes    Answer:   Yes    Order Specific Question:   Methacholine challenge    Answer:   No   Meds ordered this encounter  Medications  . Tiotropium Bromide Monohydrate (SPIRIVA RESPIMAT) 2.5 MCG/ACT AERS    Sig: Inhale 2  puffs into the lungs daily.    Dispense:  2 g    Refill:  0    Order Specific Question:   Lot Number?    Answer:   09/21/2020 B    Order Specific Question:   Expiration Date?    Answer:   05/16/2021    Order Specific Question:   Quantity    Answer:   2  . albuterol (VENTOLIN HFA) 108 (90 Base) MCG/ACT inhaler    Sig: Inhale 2 puffs into the lungs every 6 (six) hours as needed for wheezing or shortness of  breath.    Dispense:  18 g    Refill:  5    Return for after pulmonary function tests.  I have spent a total time of 45-minutes on the day of the appointment reviewing prior documentation, coordinating care and discussing medical diagnosis and plan with the patient/family. Imaging, labs and tests included in this note have been reviewed and interpreted independently by me.  Jaziel Bennett Mechele Collin, MD [facility] 07/25/2019 11:06 AM  Office Number [phone]

## 2019-07-25 NOTE — Patient Instructions (Addendum)
Shortness of breath START Spiriva 2.5 mcg TWO puffs ONCE a day. This is your EVERYDAY inhaler. CONTINUE Albuterol AS NEEDED for shortness of breath. This is your RESCUE inhaler. Will arrange for pulmonary function test. Prior to test please hold your inhalers for 48 hours for accurate testing.  Follow-up with me or NP after pulmonary function test.

## 2019-07-30 ENCOUNTER — Ambulatory Visit: Payer: PPO

## 2019-08-09 DIAGNOSIS — H40013 Open angle with borderline findings, low risk, bilateral: Secondary | ICD-10-CM | POA: Diagnosis not present

## 2019-08-16 ENCOUNTER — Ambulatory Visit: Payer: PPO | Attending: Internal Medicine

## 2019-08-16 DIAGNOSIS — Z23 Encounter for immunization: Secondary | ICD-10-CM | POA: Insufficient documentation

## 2019-08-16 NOTE — Progress Notes (Signed)
   Covid-19 Vaccination Clinic  Name:  Jennifer Bradley    MRN: [id number] DOB: December 21, 1955  08/16/2019  Jennifer Bradley was observed post Covid-19 immunization for 15 minutes without incident. She was provided with Vaccine Information Sheet and instruction to access the V-Safe system.   Jennifer Bradley was instructed to call 911 with any severe reactions post vaccine: Marland Kitchen Difficulty breathing  . Swelling of face and throat  . A fast heartbeat  . A bad rash all over body  . Dizziness and weakness   Immunizations Administered    Name Date Dose VIS Date Route   Pfizer COVID-19 Vaccine 08/16/2019 10:56 AM 0.3 mL 05/27/2019 Intramuscular   Manufacturer: [employer], [employer]   Lot: [id number]   NDC: [address]-0

## 2019-08-19 DIAGNOSIS — M2022 Hallux rigidus, left foot: Secondary | ICD-10-CM | POA: Diagnosis not present

## 2019-08-19 DIAGNOSIS — M79672 Pain in left foot: Secondary | ICD-10-CM | POA: Diagnosis not present

## 2019-08-19 DIAGNOSIS — Z4889 Encounter for other specified surgical aftercare: Secondary | ICD-10-CM | POA: Diagnosis not present

## 2019-09-19 DIAGNOSIS — Z4889 Encounter for other specified surgical aftercare: Secondary | ICD-10-CM | POA: Diagnosis not present

## 2019-09-19 DIAGNOSIS — M79672 Pain in left foot: Secondary | ICD-10-CM | POA: Diagnosis not present

## 2019-09-19 DIAGNOSIS — M2022 Hallux rigidus, left foot: Secondary | ICD-10-CM | POA: Diagnosis not present

## 2019-09-23 ENCOUNTER — Other Ambulatory Visit (HOSPITAL_COMMUNITY)
Admission: RE | Admit: 2019-09-23 | Discharge: 2019-09-23 | Disposition: A | Discharge status: Home / Self Care | Payer: PPO | Source: Ambulatory Visit | Attending: Primary Care | Admitting: Primary Care

## 2019-09-23 DIAGNOSIS — Z20822 Contact with and (suspected) exposure to covid-19: Secondary | ICD-10-CM | POA: Diagnosis not present

## 2019-09-23 DIAGNOSIS — Z01812 Encounter for preprocedural laboratory examination: Secondary | ICD-10-CM | POA: Insufficient documentation

## 2019-09-23 LAB — SARS CORONAVIRUS 2 (TAT 6-24 HRS): SARS Coronavirus 2: NEGATIVE

## 2019-09-26 ENCOUNTER — Ambulatory Visit (INDEPENDENT_AMBULATORY_CARE_PROVIDER_SITE_OTHER): Payer: PPO | Admitting: Pulmonary Disease

## 2019-09-26 ENCOUNTER — Ambulatory Visit (INDEPENDENT_AMBULATORY_CARE_PROVIDER_SITE_OTHER): Payer: PPO

## 2019-09-26 ENCOUNTER — Other Ambulatory Visit: Payer: Self-pay

## 2019-09-26 ENCOUNTER — Ambulatory Visit: Payer: PPO | Admitting: Primary Care

## 2019-09-26 ENCOUNTER — Encounter: Payer: Self-pay | Admitting: Primary Care

## 2019-09-26 VITALS — BP 126/72 | HR 57 | Temp 97.3°F | Ht 71.0 in | Wt 204.0 lb

## 2019-09-26 DIAGNOSIS — R0602 Shortness of breath: Secondary | ICD-10-CM

## 2019-09-26 DIAGNOSIS — J449 Chronic obstructive pulmonary disease, unspecified: Secondary | ICD-10-CM | POA: Diagnosis not present

## 2019-09-26 LAB — PULMONARY FUNCTION TEST
DL/VA % pred: 89 %
DL/VA: 3.56 ml/min/mmHg/L
DLCO unc % pred: 99 %
DLCO unc: 23.91 ml/min/mmHg
FEF 25-75 Post: 3.04 L/sec
FEF 25-75 Pre: 2.86 L/sec
FEF2575-%Change-Post: 6 %
FEF2575-%Pred-Post: 124 %
FEF2575-%Pred-Pre: 116 %
FEV1-%Change-Post: 2 %
FEV1-%Pred-Post: 115 %
FEV1-%Pred-Pre: 113 %
FEV1-Post: 3.45 L
FEV1-Pre: 3.38 L
FEV1FVC-%Change-Post: 6 %
FEV1FVC-%Pred-Pre: 98 %
FEV6-%Change-Post: -3 %
FEV6-%Pred-Post: 114 %
FEV6-%Pred-Pre: 119 %
FEV6-Post: 4.3 L
FEV6-Pre: 4.47 L
FEV6FVC-%Pred-Post: 103 %
FEV6FVC-%Pred-Pre: 103 %
FVC-%Change-Post: -3 %
FVC-%Pred-Post: 110 %
FVC-%Pred-Pre: 114 %
FVC-Post: 4.3 L
FVC-Pre: 4.47 L
Post FEV1/FVC ratio: 80 %
Post FEV6/FVC ratio: 100 %
Pre FEV1/FVC ratio: 76 %
Pre FEV6/FVC Ratio: 100 %
RV % pred: 116 %
RV: 2.82 L
TLC % pred: 122 %
TLC: 7.31 L

## 2019-09-26 LAB — BASIC METABOLIC PANEL
BUN: 23 mg/dL (ref 6–23)
CO2: 31 mEq/L (ref 19–32)
Calcium: 9.3 mg/dL (ref 8.4–10.5)
Chloride: 98 mEq/L (ref 96–112)
Creatinine, Ser: 0.85 mg/dL (ref 0.40–1.20)
GFR: 66.91 mL/min (ref >60.00)
Glucose, Bld: 104 mg/dL — ABNORMAL HIGH (ref 70–99)
Potassium: 3.2 mEq/L — ABNORMAL LOW (ref 3.5–5.1)
Sodium: 137 mEq/L (ref 135–145)

## 2019-09-26 LAB — CBC WITH DIFFERENTIAL/PLATELET
Basophils Absolute: 0.1 10*3/uL (ref 0.0–0.1)
Basophils Relative: 1 % (ref 0.0–3.0)
Eosinophils Absolute: 0.2 10*3/uL (ref 0.0–0.7)
Eosinophils Relative: 2.9 % (ref 0.0–5.0)
HCT: 38.1 % (ref 36.0–46.0)
Hemoglobin: 13 g/dL (ref 12.0–15.0)
Lymphocytes Relative: 28.9 % (ref 12.0–46.0)
Lymphs Abs: 2 10*3/uL (ref 0.7–4.0)
MCHC: 34.2 g/dL (ref 30.0–36.0)
MCV: 88.9 fl (ref 78.0–100.0)
Monocytes Absolute: 0.6 10*3/uL (ref 0.1–1.0)
Monocytes Relative: 8.4 % (ref 3.0–12.0)
Neutro Abs: 4 10*3/uL (ref 1.4–7.7)
Neutrophils Relative %: 58.8 % (ref 43.0–77.0)
Platelets: 273 10*3/uL (ref 150.0–400.0)
RBC: 4.29 Mil/uL (ref 3.87–5.11)
RDW: 13.6 % (ref 11.5–15.5)
WBC: 6.9 10*3/uL (ref 4.0–10.5)

## 2019-09-26 LAB — BRAIN NATRIURETIC PEPTIDE: Pro B Natriuretic peptide (BNP): 28 pg/mL (ref 0.0–100.0)

## 2019-09-26 NOTE — Patient Instructions (Addendum)
Pulmonary function testing did not show obstruction or restriction, however, there was some hyperinflation. Diffusion capacity was normal. There was no bronchodilator response.   Recommendations: - Make sure to take Spiriva two puffs once daily every day until next follow-up  - Use ventolin rescue inhaler 2 puffs prior to activities that cause shortness of breath to see if it helps   Orders: - CXR re: Dyspnea  - Labs (CBC, Bmet, BNP, IgE)  Follow-up: - 2 weeks with Dr. Everardo All or [facility]

## 2019-09-26 NOTE — Assessment & Plan Note (Addendum)
-   Pulmonary function testing did not show overt obstructive or restrictive lung disease, however, there was some hyperinflation consistent with COPD. Diffusion capacity was normal. There was no bronchodilator response.  - 09/26/2019 FEV1 3.45 (115%), ratio 80, TLC 7.31 (122%) - Encourage patient to continue Spiriva Respimat 2.66mcg two puffs once daily until next follow-up  - Use ventolin hfa  2 puffs prior to activities that cause shortness of breath  - Orders: Labs (CBC, Bmet, BNP, IgE) - CXR 09/26/2019 showed hyperinflated lungs with minimal central peribronchial thickening consistent with COPD - Consider changing Metoprolol to Bisoprolol and checking echocardiogram.  - Follow-up: 2 weeks with Dr. Everardo All or [facility]

## 2019-09-26 NOTE — Progress Notes (Signed)
PFT completed today.  

## 2019-09-26 NOTE — Addendum Note (Signed)
Addended by: Demetrio Lapping E on: 09/26/2019 11:13 AM   Modules accepted: Orders

## 2019-09-26 NOTE — Progress Notes (Signed)
@Patient  ID: , female    DOB: Nov 02, 1955, 64 y.o.   MRN: [id number]  Chief Complaint  Patient presents with  . Follow-up    Had PFT today    Referring provider: [id number], MD  HPI: 64 year old female, former smoker quit 2019. PMH significant for shortness of breath, hypertension, GERD, hyperlipidemia. Patient of Dr. 2020, seen for initial consult on 07/25/19.   09/26/2019 Patient presents today for 2 month follow-up with PFTs. Reports that she has been experiencing dyspnea on exertion for quite awhile now. Feels as thought she is not able to take a deep breath. She was given samples of Spiriva during her last visit but admits to not using it regularly. On average she takes it twice a week when she remembers. She has not tried using Ventolin rescue inhaler. She notices shortness of breath when she does activities that require her to exert herself quickly such as walking up stairs or carrying laundry. She does report swelling in ankles during the day. She has a rare dry cough. She she will coughing need to lie down when unable to catch her breath. Denies wheezing or chest tightness.   PFTs 09/26/2019 - FVC 4.30 (110%), FEV1 3.45 (115%), ratio 80, TLC 7.31 (122%), no BD, DLCOunc 23.91 (99%)  Allergies  Allergen Reactions  . Penicillins Rash    Immunization History  Administered Date(s) Administered  . Influenza, High Dose Seasonal PF 02/22/2019  . PFIZER SARS-COV-2 Vaccination 07/22/2019, 08/16/2019    Past Medical History:  Diagnosis Date  . Allergy   . Cataract   . COPD (chronic obstructive pulmonary disease) (HCC)   . GERD (gastroesophageal reflux disease)   . Hyperlipidemia   . Hypertension   . PONV (postoperative nausea and vomiting)   . Thyroid disease     Tobacco History: Social History   Tobacco Use  Smoking Status Former Smoker  . Packs/day: 0.75  . Years: 20.00  . Pack years: 15.00  . Types: Cigarettes  . Quit date: 2019  . Years since quitting:  2.2  Smokeless Tobacco Never Used   Counseling given: Not Answered   Outpatient Medications Prior to Visit  Medication Sig Dispense Refill  . albuterol (VENTOLIN HFA) 108 (90 Base) MCG/ACT inhaler Inhale 2 puffs into the lungs every 6 (six) hours as needed for wheezing or shortness of breath. 18 g 5  . hydrochlorothiazide (HYDRODIURIL) 25 MG tablet TK 1 T PO QD  3  . metoprolol (LOPRESSOR) 100 MG tablet TK 1 T PO QD  1  . olmesartan (BENICAR) 20 MG tablet Take 20 mg by mouth daily.    . pantoprazole (PROTONIX) 40 MG tablet 40 mg daily.  11  . rosuvastatin (CRESTOR) 20 MG tablet TK 1 T PO QD  11  . SYNTHROID 137 MCG tablet TK 1 T PO QD  0  . Tiotropium Bromide Monohydrate (SPIRIVA RESPIMAT) 2.5 MCG/ACT AERS Inhale 2 puffs into the lungs daily. 2 g 0   No facility-administered medications prior to visit.    Review of Systems  Review of Systems  Respiratory: Positive for cough and shortness of breath. Negative for apnea, choking, chest tightness, wheezing and stridor.   Cardiovascular: Positive for leg swelling.   Physical Exam  BP 126/72 (BP Location: Left Arm, Cuff Size: Normal)   Pulse (!) 57   Temp (!) 97.3 F (36.3 C) (Temporal)   Ht 5\' 11"  (1.803 m)   Wt 204 lb (92.5 kg)   SpO2 100%  BMI 28.45 kg/m  Physical Exam Constitutional:      Appearance: Normal appearance.     Comments: Tall adult female   HENT:     Head: Normocephalic and atraumatic.  Cardiovascular:     Rate and Rhythm: Normal rate and regular rhythm.     Comments: +Trace pedal edema Pulmonary:     Effort: Pulmonary effort is normal.     Breath sounds: Normal breath sounds.  Skin:    General: Skin is warm and dry.  Neurological:     General: No focal deficit present.     Mental Status: She is alert and oriented to person, place, and time. Mental status is at baseline.  Psychiatric:        Mood and Affect: Mood normal.        Behavior: Behavior normal.        Thought Content: Thought content  normal.        Judgment: Judgment normal.      Lab Results:  CBC    Component Value Date/Time   HGB 13.4 11/15/2008 0737    BMET    Component Value Date/Time   NA 139 07/04/2019 0746   K 3.7 07/04/2019 0746   CL 103 07/04/2019 0746   CO2 26 07/04/2019 0746   GLUCOSE 140 (H) 07/04/2019 0746   BUN 17 07/04/2019 0746   CREATININE 0.95 07/04/2019 0746   CALCIUM 9.1 07/04/2019 0746   GFRNONAA >60 07/04/2019 0746   GFRAA >60 07/04/2019 0746    BNP No results found for: BNP  ProBNP No results found for: PROBNP  Imaging: No results found.   Assessment & Plan:   Shortness of breath - Pulmonary function testing did not show evidence of obstructive or restrictive lung disease, however, there was some hyperinflation. Diffusion capacity was normal. There was no bronchodilator response.  - 09/26/2019 FEV1 3.45 (115%), ratio 80, TLC 7.31 (122%) - Encourage patient to continue Spiriva Respimat 2.95mcg two puffs once daily until next follow-up  - Use ventolin hfa  2 puffs prior to activities that cause shortness of breath  - Orders: CXR and Labs (CBC, Bmet, BNP, IgE) - If CXR and labs normal and no benefit from LAMA, consider changing Metoprolol to Bisoprolol and checking echocardiogram.  - Follow-up: 2 weeks with Dr. Loanne Drilling or Maryclare Bean, NP 09/26/2019

## 2019-09-27 LAB — IGE: IgE (Immunoglobulin E), Serum: 34 kU/L (ref <=114)

## 2019-09-30 ENCOUNTER — Telehealth: Payer: Self-pay | Admitting: Pulmonary Disease

## 2019-09-30 NOTE — Telephone Encounter (Signed)
Pt returning call. [phone]

## 2019-09-30 NOTE — Telephone Encounter (Signed)
Spoke with pt. She is aware of results. Nothing further was needed.  

## 2019-09-30 NOTE — Telephone Encounter (Addendum)
Glenford Bayley, NP  09/26/2019 1:52 PM EDT    Please let patient know labs wnl. Potassium was a little low- recommend she add food rich in potassium such as bananas, sweet potato's, green leafy vegtables. We will recheck at next visit. Awaiting CXR results  -------------------------------------------------------------- LMTCB x1 for pt.

## 2019-10-10 ENCOUNTER — Other Ambulatory Visit: Payer: Self-pay

## 2019-10-10 ENCOUNTER — Encounter: Payer: Self-pay | Admitting: Primary Care

## 2019-10-10 ENCOUNTER — Telehealth: Payer: Self-pay | Admitting: Primary Care

## 2019-10-10 ENCOUNTER — Ambulatory Visit: Payer: PPO | Admitting: Primary Care

## 2019-10-10 ENCOUNTER — Other Ambulatory Visit (INDEPENDENT_AMBULATORY_CARE_PROVIDER_SITE_OTHER): Payer: PPO

## 2019-10-10 DIAGNOSIS — J449 Chronic obstructive pulmonary disease, unspecified: Secondary | ICD-10-CM

## 2019-10-10 LAB — BASIC METABOLIC PANEL
BUN: 18 mg/dL (ref 6–23)
CO2: 31 mEq/L (ref 19–32)
Calcium: 9.4 mg/dL (ref 8.4–10.5)
Chloride: 101 mEq/L (ref 96–112)
Creatinine, Ser: 0.94 mg/dL (ref 0.40–1.20)
GFR: 59.57 mL/min — ABNORMAL LOW (ref >60.00)
Glucose, Bld: 104 mg/dL — ABNORMAL HIGH (ref 70–99)
Potassium: 3.7 mEq/L (ref 3.5–5.1)
Sodium: 138 mEq/L (ref 135–145)

## 2019-10-10 MED ORDER — SPIRIVA RESPIMAT 2.5 MCG/ACT IN AERS: 2.0000 | INHALATION_SPRAY | Freq: Every day | RESPIRATORY_TRACT | 6 refills | Status: AC

## 2019-10-10 NOTE — Progress Notes (Signed)
@Patient  ID: , female    DOB: Apr 23, 1956, 64 y.o.   MRN: [id number]  Chief Complaint  Patient presents with  . Follow-up    2 wk f/u for SOB. States she is feeling much better since last visit.     Referring provider: [id number], MD  HPI:  64 year old female, former smoker quit 2019. PMH significant for shortness of breath, hypertension, GERD, hyperlipidemia. Patient of Dr. 2020, seen for initial consult on 07/25/19.   09/26/2019 Patient presents today for 2 month follow-up with PFTs. Reports that she has been experiencing dyspnea on exertion for quite awhile now. Feels as thought she is not able to take a deep breath. She was given samples of Spiriva during her last visit but admits to not using it regularly. On average she takes it twice a week when she remembers. She has not tried using Ventolin rescue inhaler. She notices shortness of breath when she does activities that require her to exert herself quickly such as walking up stairs or carrying laundry. She does report swelling in ankles during the day. She has a rare dry cough. She she will coughing need to lie down when unable to catch her breath. Denies wheezing or chest tightness.   10/10/2019 Patient presents today for 2 week follow-up. She has been taking her Spiriva Respimat regularly.  Since then, her breathing has been noticeably better. States that she can walk with her hudband without feeling short of breath and do yard work. CXR and PFTs both showed signs of hyperinflation consistent with COPD. She quit smoking 2-3 years ago. She was an intermittent smoker from the age of 32.    PFTs 09/26/2019 - FVC 4.30 (110%), FEV1 3.45 (115%), ratio 80, TLC 7.31 (122%), no BD, DLCOunc 23.91 (99%).     Allergies  Allergen Reactions  . Penicillins Rash    Immunization History  Administered Date(s) Administered  . Influenza, High Dose Seasonal PF 02/22/2019  . PFIZER SARS-COV-2 Vaccination 07/22/2019, 08/16/2019    Past  Medical History:  Diagnosis Date  . Allergy   . Cataract   . COPD (chronic obstructive pulmonary disease) (HCC)   . GERD (gastroesophageal reflux disease)   . Hyperlipidemia   . Hypertension   . PONV (postoperative nausea and vomiting)   . Thyroid disease     Tobacco History: Social History   Tobacco Use  Smoking Status Former Smoker  . Packs/day: 0.75  . Years: 20.00  . Pack years: 15.00  . Types: Cigarettes  . Quit date: 2019  . Years since quitting: 2.3  Smokeless Tobacco Never Used   Counseling given: Not Answered   Outpatient Medications Prior to Visit  Medication Sig Dispense Refill  . albuterol (VENTOLIN HFA) 108 (90 Base) MCG/ACT inhaler Inhale 2 puffs into the lungs every 6 (six) hours as needed for wheezing or shortness of breath. 18 g 5  . hydrochlorothiazide (HYDRODIURIL) 25 MG tablet TK 1 T PO QD  3  . metoprolol (LOPRESSOR) 100 MG tablet TK 1 T PO QD  1  . olmesartan (BENICAR) 20 MG tablet Take 20 mg by mouth daily.    . pantoprazole (PROTONIX) 40 MG tablet 40 mg daily.  11  . rosuvastatin (CRESTOR) 20 MG tablet TK 1 T PO QD  11  . SYNTHROID 137 MCG tablet TK 1 T PO QD  0  . Tiotropium Bromide Monohydrate (SPIRIVA RESPIMAT) 2.5 MCG/ACT AERS Inhale 2 puffs into the lungs daily. 2 g 0  No facility-administered medications prior to visit.    Review of Systems  Review of Systems  Respiratory: Negative for cough and shortness of breath.    Physical Exam  BP 116/76   Pulse 60   Temp 97.6 F (36.4 C) (Temporal)   Ht 5\' 11"  (1.803 m)   Wt 207 lb 3.2 oz (94 kg)   SpO2 98% Comment: on RA  BMI 28.90 kg/m  Physical Exam Constitutional:      Appearance: Normal appearance.  HENT:     Head: Normocephalic and atraumatic.  Cardiovascular:     Rate and Rhythm: Normal rate and regular rhythm.  Pulmonary:     Effort: Pulmonary effort is normal. No respiratory distress.     Breath sounds: No wheezing.  Neurological:     General: No focal deficit present.      Mental Status: She is alert and oriented to person, place, and time. Mental status is at baseline.  Psychiatric:        Mood and Affect: Mood normal.        Behavior: Behavior normal.        Thought Content: Thought content normal.        Judgment: Judgment normal.      Lab Results:  CBC    Component Value Date/Time   WBC 6.9 09/26/2019 1113   RBC 4.29 09/26/2019 1113   HGB 13.0 09/26/2019 1113   HCT 38.1 09/26/2019 1113   PLT 273.0 09/26/2019 1113   MCV 88.9 09/26/2019 1113   MCHC 34.2 09/26/2019 1113   RDW 13.6 09/26/2019 1113   LYMPHSABS 2.0 09/26/2019 1113   MONOABS 0.6 09/26/2019 1113   EOSABS 0.2 09/26/2019 1113   BASOSABS 0.1 09/26/2019 1113    BMET    Component Value Date/Time   NA 137 09/26/2019 1113   K 3.2 (L) 09/26/2019 1113   CL 98 09/26/2019 1113   CO2 31 09/26/2019 1113   GLUCOSE 104 (H) 09/26/2019 1113   BUN 23 09/26/2019 1113   CREATININE 0.85 09/26/2019 1113   CALCIUM 9.3 09/26/2019 1113   GFRNONAA >60 07/04/2019 0746   GFRAA >60 07/04/2019 0746    BNP No results found for: BNP  ProBNP    Component Value Date/Time   PROBNP 28.0 09/26/2019 1113    Imaging: DG Chest 2 View  Result Date: 09/26/2019 CLINICAL DATA:  Dyspnea, COPD EXAM: CHEST - 2 VIEW COMPARISON:  None FINDINGS: Normal heart size, mediastinal contours, and pulmonary vascularity. Lungs hyperinflated with minimal central peribronchial thickening consistent with COPD. No infiltrate, pleural effusion, or pneumothorax. Osseous structures unremarkable. LEFT nipple shadow identified, also seen on lateral view. IMPRESSION: COPD changes without acute infiltrate. Electronically Signed   By: Lavonia Dana M.D.   On: 09/26/2019 15:49     Assessment & Plan:   COPD, mild ([place]) - Dyspnea significantly improved with regular LAMA use - CXR and PFTs both showed signs of hyperinflation consistent with COPD - Continue Spiriva two puffs once daily - Use Ventolin (albuterol) rescue inhaler 2  puffs every 4-6 hours as needed for break through shortness of breath or wheezing - Follow-up in 6 months with Dr. Loanne Drilling or sooner if needed    Martyn Ehrich, NP 10/10/2019

## 2019-10-10 NOTE — Patient Instructions (Signed)
Continue Spiriva two puffs once daily Use Ventolin (albuterol) rescue inhaler 2 puffs every 4-6 hours as needed for break through shortness of breath or wheezing  Follow-up in 6 months with Dr. Everardo All or sooner if needed    COPD and Physical Activity Chronic obstructive pulmonary disease (COPD) is a long-term (chronic) condition that affects the lungs. COPD is a general term that can be used to describe many different lung problems that cause lung swelling (inflammation) and limit airflow, including chronic bronchitis and emphysema. The main symptom of COPD is shortness of breath, which makes it harder to do even simple tasks. This can also make it harder to exercise and be active. Talk with your health care provider about treatments to help you breathe better and actions you can take to prevent breathing problems during physical activity. What are the benefits of exercising with COPD? Exercising regularly is an important part of a healthy lifestyle. You can still exercise and do physical activities even though you have COPD. Exercise and physical activity improve your shortness of breath by increasing blood flow (circulation). This causes your heart to pump more oxygen through your body. Moderate exercise can improve your:  Oxygen use.  Energy level.  Shortness of breath.  Strength in your breathing muscles.  Heart health.  Sleep.  Self-esteem and feelings of self-worth.  Depression, stress, and anxiety levels. Exercise can benefit everyone with COPD. The severity of your disease may affect how hard you can exercise, especially at first, but everyone can benefit. Talk with your health care provider about how much exercise is safe for you, and which activities and exercises are safe for you. What actions can I take to prevent breathing problems during physical activity?  Sign up for a pulmonary rehabilitation program. This type of program may include: ? Education about lung  diseases. ? Exercise classes that teach you how to exercise and be more active while improving your breathing. This usually involves:  Exercise using your lower extremities, such as a stationary bicycle.  About 30 minutes of exercise, 2 to 5 times per week, for 6 to 12 weeks  Strength training, such as push ups or leg lifts. ? Nutrition education. ? Group classes in which you can talk with others who also have COPD and learn ways to manage stress.  If you use an oxygen tank, you should use it while you exercise. Work with your health care provider to adjust your oxygen for your physical activity. Your resting flow rate is different from your flow rate during physical activity.  While you are exercising: ? Take slow breaths. ? Pace yourself and do not try to go too fast. ? Purse your lips while breathing out. Pursing your lips is similar to a kissing or whistling position. ? If doing exercise that uses a quick burst of effort, such as weight lifting:  Breathe in before starting the exercise.  Breathe out during the hardest part of the exercise (such as raising the weights). Where to find support You can find support for exercising with COPD from:  Your health care provider.  A pulmonary rehabilitation program.  Your local health department or community health programs.  Support groups, online or in-person. Your health care provider may be able to recommend support groups. Where to find more information You can find more information about exercising with COPD from:  American Lung Association: [place].[place].  COPD Foundation: AlmostHot.gl. Contact a health care provider if:  Your symptoms get worse.  You have chest  pain.  You have nausea.  You have a fever.  You have trouble talking or catching your breath.  You want to start a new exercise program or a new activity. Summary  COPD is a general term that can be used to describe many different lung problems that cause  lung swelling (inflammation) and limit airflow. This includes chronic bronchitis and emphysema.  Exercise and physical activity improve your shortness of breath by increasing blood flow (circulation). This causes your heart to provide more oxygen to your body.  Contact your health care provider before starting any exercise program or new activity. Ask your health care provider what exercises and activities are safe for you. This information is not intended to replace advice given to you by your health care provider. Make sure you discuss any questions you have with your health care provider. Document Revised: 09/22/2018 Document Reviewed: 06/25/2017 Elsevier Patient Education  2020 [employer].

## 2019-10-10 NOTE — Assessment & Plan Note (Signed)
-   Dyspnea significantly improved with regular LAMA use - CXR and PFTs both showed signs of hyperinflation consistent with COPD - Continue Spiriva two puffs once daily - Use Ventolin (albuterol) rescue inhaler 2 puffs every 4-6 hours as needed for break through shortness of breath or wheezing - Follow-up in 6 months with Dr. Everardo All or sooner if needed

## 2019-10-10 NOTE — Telephone Encounter (Signed)
Called and spoke with pt who stated the spiriva was going to cost her $45 for a 61-month supply which she said is too expensive for her. Stated to her that I would call pharmacy to see if it would be cheaper for her to get a 40-month supply next time vs the 47-month supply and she verbalized understanding.  Called pt's pharmacy to see how much a 29-month supply of the spiriva would be and when they looked at pt's insurance, there was a note that said that pt's insurance prefers a 50-month supply.  Called and spoke with pt letting her know this and stated to her when she needed a refill, stated to her to tell the pharmacy to run it as a 60-month supply and she verbalized understanding. Nothing further needed.

## 2019-11-29 ENCOUNTER — Other Ambulatory Visit: Payer: PPO

## 2019-12-05 ENCOUNTER — Ambulatory Visit: Payer: PPO | Attending: Internal Medicine

## 2019-12-05 DIAGNOSIS — Z20822 Contact with and (suspected) exposure to covid-19: Secondary | ICD-10-CM | POA: Diagnosis not present

## 2019-12-06 LAB — NOVEL CORONAVIRUS, NAA: SARS-CoV-2, NAA: NOT DETECTED

## 2019-12-06 LAB — SARS-COV-2, NAA 2 DAY TAT

## 2019-12-12 ENCOUNTER — Ambulatory Visit: Payer: PPO | Attending: Internal Medicine

## 2019-12-12 DIAGNOSIS — Z20822 Contact with and (suspected) exposure to covid-19: Secondary | ICD-10-CM | POA: Diagnosis not present

## 2019-12-13 LAB — NOVEL CORONAVIRUS, NAA: SARS-CoV-2, NAA: NOT DETECTED

## 2019-12-13 LAB — SARS-COV-2, NAA 2 DAY TAT

## 2019-12-23 DIAGNOSIS — M859 Disorder of bone density and structure, unspecified: Secondary | ICD-10-CM | POA: Diagnosis not present

## 2019-12-23 DIAGNOSIS — E7849 Other hyperlipidemia: Secondary | ICD-10-CM | POA: Diagnosis not present

## 2019-12-23 DIAGNOSIS — E038 Other specified hypothyroidism: Secondary | ICD-10-CM | POA: Diagnosis not present

## 2019-12-23 DIAGNOSIS — I10 Essential (primary) hypertension: Secondary | ICD-10-CM | POA: Diagnosis not present

## 2019-12-23 DIAGNOSIS — R7301 Impaired fasting glucose: Secondary | ICD-10-CM | POA: Diagnosis not present

## 2019-12-30 DIAGNOSIS — R3121 Asymptomatic microscopic hematuria: Secondary | ICD-10-CM | POA: Diagnosis not present

## 2019-12-30 DIAGNOSIS — J449 Chronic obstructive pulmonary disease, unspecified: Secondary | ICD-10-CM | POA: Diagnosis not present

## 2019-12-30 DIAGNOSIS — R7301 Impaired fasting glucose: Secondary | ICD-10-CM | POA: Diagnosis not present

## 2019-12-30 DIAGNOSIS — M8580 Other specified disorders of bone density and structure, unspecified site: Secondary | ICD-10-CM | POA: Diagnosis not present

## 2019-12-30 DIAGNOSIS — N859 Noninflammatory disorder of uterus, unspecified: Secondary | ICD-10-CM | POA: Diagnosis not present

## 2019-12-30 DIAGNOSIS — Z1212 Encounter for screening for malignant neoplasm of rectum: Secondary | ICD-10-CM | POA: Diagnosis not present

## 2019-12-30 DIAGNOSIS — E739 Lactose intolerance, unspecified: Secondary | ICD-10-CM | POA: Diagnosis not present

## 2019-12-30 DIAGNOSIS — R82998 Other abnormal findings in urine: Secondary | ICD-10-CM | POA: Diagnosis not present

## 2019-12-30 DIAGNOSIS — Z0000 Encounter for general adult medical examination without abnormal findings: Secondary | ICD-10-CM | POA: Diagnosis not present

## 2019-12-30 DIAGNOSIS — Z79899 Other long term (current) drug therapy: Secondary | ICD-10-CM | POA: Diagnosis not present

## 2019-12-30 DIAGNOSIS — N839 Noninflammatory disorder of ovary, fallopian tube and broad ligament, unspecified: Secondary | ICD-10-CM | POA: Diagnosis not present

## 2019-12-30 DIAGNOSIS — H9190 Unspecified hearing loss, unspecified ear: Secondary | ICD-10-CM | POA: Diagnosis not present

## 2019-12-30 DIAGNOSIS — I10 Essential (primary) hypertension: Secondary | ICD-10-CM | POA: Diagnosis not present

## 2019-12-30 DIAGNOSIS — F17201 Nicotine dependence, unspecified, in remission: Secondary | ICD-10-CM | POA: Diagnosis not present

## 2019-12-30 DIAGNOSIS — H9319 Tinnitus, unspecified ear: Secondary | ICD-10-CM | POA: Diagnosis not present

## 2020-01-02 ENCOUNTER — Other Ambulatory Visit: Payer: Self-pay | Admitting: Internal Medicine

## 2020-01-02 DIAGNOSIS — M8580 Other specified disorders of bone density and structure, unspecified site: Secondary | ICD-10-CM

## 2020-01-06 DIAGNOSIS — M810 Age-related osteoporosis without current pathological fracture: Secondary | ICD-10-CM | POA: Diagnosis not present

## 2020-01-24 ENCOUNTER — Encounter: Payer: Self-pay | Admitting: Cardiology

## 2020-01-24 ENCOUNTER — Ambulatory Visit: Payer: PPO | Admitting: Cardiology

## 2020-01-24 ENCOUNTER — Other Ambulatory Visit: Payer: Self-pay

## 2020-01-24 VITALS — BP 153/94 | HR 84 | Resp 16 | Ht 71.0 in | Wt 204.0 lb

## 2020-01-24 DIAGNOSIS — Z8249 Family history of ischemic heart disease and other diseases of the circulatory system: Secondary | ICD-10-CM

## 2020-01-24 DIAGNOSIS — I209 Angina pectoris, unspecified: Secondary | ICD-10-CM

## 2020-01-24 DIAGNOSIS — I10 Essential (primary) hypertension: Secondary | ICD-10-CM | POA: Diagnosis not present

## 2020-01-24 DIAGNOSIS — R079 Chest pain, unspecified: Secondary | ICD-10-CM | POA: Diagnosis not present

## 2020-01-24 MED ORDER — NITROGLYCERIN 0.4 MG SL SUBL: 0.4000 mg | SUBLINGUAL_TABLET | SUBLINGUAL | 3 refills | Status: DC | PRN

## 2020-01-24 MED ORDER — METOPROLOL TARTRATE 100 MG PO TABS: 100.0000 mg | ORAL_TABLET | Freq: Two times a day (BID) | ORAL | 2 refills | Status: DC

## 2020-01-24 MED ORDER — ASPIRIN 81 MG PO CHEW: 81.0000 mg | CHEWABLE_TABLET | Freq: Every day | ORAL | 2 refills | Status: DC

## 2020-01-24 NOTE — Progress Notes (Signed)
Patient referred by Crist Infante, MD for chest  Subjective:   Jennifer Bradley, female    DOB: August 22, 1955, 64 y.o.   MRN: [id number]   Chief Complaint  Patient presents with  . Chest Pain  . Hypertension  . Follow-up    Referred by Dr. Joylene Draft     HPI  64 y.o. 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  She currently works part-time at [employer].  Patient states she made this appointment at the insistence of her husband.  She has noticed at least 2-3 episodes of retrosternal chest tightness, that feels like "bra fitting tight".  Episodes also occur at rest, as well as during moving around.  Patient has longstanding hypertension. She quit smoking in 2019. She has mild exertional dyspnea, attributed to COPD, improved with inhalers. She also reports right calf pain, mostly at rest. She does not report exertional claudication.   She has strong family h/oCAD, with all mothers sibling, and most of her own brothers having had CAD.   Past Medical History:  Diagnosis Date  . Allergy   . Cataract   . COPD (chronic obstructive pulmonary disease) ([place])   . GERD (gastroesophageal reflux disease)   . Hyperlipidemia   . Hypertension   . PONV (postoperative nausea and vomiting)   . Thyroid disease     Past Surgical History:  Procedure Laterality Date  . APPENDECTOMY    . ARTHRODESIS METATARSALPHALANGEAL JOINT (MTPJ) Left 07/07/2019   Procedure: Left hallux metatarsal phalangeal joint arthrodesis;  Surgeon: Wylene Simmer, MD;  Location: [place];  Service: Orthopedics;  Laterality: Left;  . BLADDER SUSPENSION    . broken wrist Left   . BUNIONECTOMY Left   . CHOLECYSTECTOMY    . HARDWARE REMOVAL Left 07/07/2019   Procedure: Hardware removal left foot;  Surgeon: Wylene Simmer, MD;  Location: [place];  Service: Orthopedics;  Laterality: Left;  . THYROIDECTOMY       Social History   Tobacco Use  Smoking Status Former Smoker  . Packs/day: 0.75  . Years: 20.00  . Pack years: 15.00  . Types: Cigarettes  . Quit date: 2019  . Years since quitting: 2.6  Smokeless Tobacco Never Used    Social History   Substance and Sexual Activity  Alcohol Use Yes   Comment: occasional wine     Family History  Problem Relation Age of Onset  . Colon cancer Neg Hx   . Esophageal cancer Neg Hx   . Rectal cancer Neg Hx   . Stomach cancer Neg Hx      Current Outpatient Medications on File Prior to Visit  Medication Sig Dispense Refill  . hydrochlorothiazide (HYDRODIURIL) 25 MG tablet 25 mg.   3  . metoprolol (LOPRESSOR) 100 MG tablet Take 100 mg by mouth.   1  . olmesartan (BENICAR) 20 MG tablet Take 20 mg by mouth daily.    . pantoprazole (PROTONIX) 40 MG tablet 40 mg daily.  11  . rosuvastatin (CRESTOR) 20 MG tablet Take 20 mg by mouth daily.   11  . SYNTHROID 137 MCG tablet 137 mcg.   0  . Tiotropium Bromide Monohydrate (SPIRIVA RESPIMAT) 2.5 MCG/ACT AERS Inhale 2 puffs into the lungs daily. 4 g 6   No current facility-administered medications on file prior to visit.    Cardiovascular  and other pertinent studies:  EKG 01/24/2020: Sinus rhythm 82 bpm Normal EKG    Recent labs: 12/23/2019: Glucose 108, BUN/Cr 16/1.0. EGFR 55/67. Na/K 142/3.9. Rest of the CMP normal H/H 14/42. MCV 91. Platelets 252 HbA1C 5.5% Chol 138, TG 132, HDL 41, LDL 71 TSH 0.47 normal    Review of Systems  Cardiovascular: Positive for chest pain and dyspnea on exertion. Negative for leg swelling, palpitations and syncope.         Vitals:   01/24/20 1647  BP: (!) 153/94  Pulse: 84  Resp: 16  SpO2: 97%     Body mass index is 28.45 kg/m. Filed Weights   01/24/20 1647  Weight: 204 lb (92.5 kg)     Objective:   Physical Exam Vitals and nursing note reviewed.    Constitutional:      General: She is not in acute distress. Neck:     Vascular: No JVD.  Cardiovascular:     Rate and Rhythm: Normal rate and regular rhythm.     Pulses:          Dorsalis pedis pulses are 1+ on the right side and 2+ on the left side.       Posterior tibial pulses are 2+ on the right side and 2+ on the left side.     Heart sounds: Normal heart sounds. No murmur heard.   Pulmonary:     Effort: Pulmonary effort is normal.     Breath sounds: Normal breath sounds. No wheezing or rales.  Musculoskeletal:     Right lower leg: No edema.     Left lower leg: No edema.          Assessment & Recommendations:   64 y.o. Jennifer Bradley female with hypertension, hyperlipidemia, depression, COPD, former smoker, strong family h/o CAD, referred for evaluation of chest pain  Chest pain: High suspicion of angina. Recommend coronary CTA. In the meantime, recommend Aspirin 81 mg, prn nitro, increase metoprolol tartar64 y.o. Jennifer Bradley female with hypertension, hyperlipidemia, depression, COPD, former smoker, strong family h/o CAD, referred for evaluation of chest painate to 100 mg bid.  Will check echocardiogram.  Leg pain: Atypical for claudication. Rt DP 1. Recommend medical management for now.  Further recommendations after above testing.   Thank you for referring the patient to [place]. Please feel free to contact with any questions.  Nigel Mormon, MD [facility]. [facility] Pager: [phone] Office: [phone]

## 2020-01-24 NOTE — Patient Instructions (Signed)
Your cardiac CT will be scheduled at the locations below:   [facility]  [address]  [place], [place] [address]  [phone]    If scheduled at [facility], please arrive at the [facility] main entrance of [facility] 30-45 minutes prior to test start time.  Proceed to the [facility] Radiology Department (first floor) to check-in and test prep.   Please follow these instructions carefully (unless otherwise directed):    On the Night Before the Test:   Be sure to Drink plenty of water.   Do not consume any caffeinated/decaffeinated beverages or chocolate 12 hours prior to your test.   Do not take any antihistamines 12 hours prior to your test.   If the patient has contrast allergy:  Patient will need a prescription for Prednisone and very clear instructions (as follows):  1. Prednisone 50 mg - take 13 hours prior to test  2. Take another Prednisone 50 mg 7 hours prior to test  3. Take another Prednisone 50 mg 1 hour prior to test  4. Take Benadryl 50 mg 1 hour prior to test   Patient must complete all four doses of above prophylactic medications.   Patient will need a ride after test due to Benadryl.   On the Day of the Test:   Drink plenty of water. Do not drink any water within one hour of the test.   Do not eat any food 4 hours prior to the test.   You may take your regular medications prior to the test.    - Metoprolol tartarate Take 100 mg morning dose 2 hours before CT scan   FEMALES- please wear underwire-free bra if available         After the Test:   Drink plenty of water.   After receiving IV contrast, you may experience a mild flushed feeling. This is normal.   On occasion, you may experience a mild rash up to 24 hours after the test. This is not dangerous. If this occurs, you can take Benadryl 25 mg and increase your fluid intake.   If you experience trouble breathing, this can be serious. If it is severe call 911  IMMEDIATELY. If it is mild, please call our office.   If you take any of these medications: Glipizide/Metformin, Avandament, Glucavance, please do not take 48 hours after completing test unless otherwise instructed.     Please contact the cardiac imaging nurse navigator should you have any questions/concerns  Rockwell Alexandria, RN Navigator Cardiac Imaging  [facility]  [phone] Office  [phone] Cell

## 2020-01-27 ENCOUNTER — Other Ambulatory Visit: Payer: Self-pay | Admitting: Adult Health

## 2020-01-27 ENCOUNTER — Ambulatory Visit
Admission: RE | Admit: 2020-01-27 | Discharge: 2020-01-27 | Disposition: A | Discharge status: Home / Self Care | Payer: PPO | Source: Ambulatory Visit | Attending: Adult Health | Admitting: Adult Health

## 2020-01-27 DIAGNOSIS — N838 Other noninflammatory disorders of ovary, fallopian tube and broad ligament: Secondary | ICD-10-CM | POA: Diagnosis not present

## 2020-01-27 DIAGNOSIS — R310 Gross hematuria: Secondary | ICD-10-CM | POA: Diagnosis not present

## 2020-01-27 DIAGNOSIS — E039 Hypothyroidism, unspecified: Secondary | ICD-10-CM | POA: Diagnosis not present

## 2020-01-27 DIAGNOSIS — R109 Unspecified abdominal pain: Secondary | ICD-10-CM | POA: Diagnosis not present

## 2020-01-27 DIAGNOSIS — K449 Diaphragmatic hernia without obstruction or gangrene: Secondary | ICD-10-CM | POA: Diagnosis not present

## 2020-01-27 DIAGNOSIS — M545 Low back pain: Secondary | ICD-10-CM | POA: Diagnosis not present

## 2020-01-27 DIAGNOSIS — R1030 Lower abdominal pain, unspecified: Secondary | ICD-10-CM | POA: Diagnosis not present

## 2020-01-27 DIAGNOSIS — I700 Atherosclerosis of aorta: Secondary | ICD-10-CM | POA: Diagnosis not present

## 2020-01-27 DIAGNOSIS — R05 Cough: Secondary | ICD-10-CM | POA: Diagnosis not present

## 2020-01-27 DIAGNOSIS — N390 Urinary tract infection, site not specified: Secondary | ICD-10-CM | POA: Diagnosis not present

## 2020-01-30 DIAGNOSIS — R109 Unspecified abdominal pain: Secondary | ICD-10-CM | POA: Diagnosis not present

## 2020-01-30 DIAGNOSIS — R310 Gross hematuria: Secondary | ICD-10-CM | POA: Diagnosis not present

## 2020-01-30 DIAGNOSIS — R1030 Lower abdominal pain, unspecified: Secondary | ICD-10-CM | POA: Diagnosis not present

## 2020-01-30 DIAGNOSIS — R05 Cough: Secondary | ICD-10-CM | POA: Diagnosis not present

## 2020-01-31 NOTE — Addendum Note (Signed)
Addended by: Elder Negus on: 01/31/2020 09:24 AM   Modules accepted: Orders

## 2020-02-06 ENCOUNTER — Other Ambulatory Visit: Payer: Self-pay | Admitting: Cardiology

## 2020-02-06 ENCOUNTER — Other Ambulatory Visit: Payer: Self-pay

## 2020-02-06 ENCOUNTER — Telehealth (HOSPITAL_COMMUNITY): Payer: Self-pay | Admitting: Emergency Medicine

## 2020-02-06 DIAGNOSIS — I10 Essential (primary) hypertension: Secondary | ICD-10-CM

## 2020-02-06 DIAGNOSIS — I209 Angina pectoris, unspecified: Secondary | ICD-10-CM

## 2020-02-06 NOTE — Telephone Encounter (Signed)
Reaching out to patient to offer assistance regarding upcoming cardiac imaging study; pt verbalizes understanding of appt date/time, parking situation and where to check in, pre-test NPO status and medications ordered, and verified current allergies; name and call back number provided for further questions should they arise Jennifer Narciso RN Navigator Cardiac Imaging [facility] [phone] office [phone] cell 

## 2020-02-07 LAB — BMP8+EGFR
BUN/Creatinine Ratio: 20 (ref 12–28)
BUN: 17 mg/dL (ref 8–27)
CO2: 25 mmol/L (ref 20–29)
Calcium: 9.5 mg/dL (ref 8.7–10.3)
Chloride: 101 mmol/L (ref 96–106)
Creatinine, Ser: 0.86 mg/dL (ref 0.57–1.00)
GFR calc Af Amer: 81 mL/min/{1.73_m2} (ref >59)
GFR calc non Af Amer: 71 mL/min/{1.73_m2} (ref >59)
Glucose: 104 mg/dL — ABNORMAL HIGH (ref 65–99)
Potassium: 3.8 mmol/L (ref 3.5–5.2)
Sodium: 139 mmol/L (ref 134–144)

## 2020-02-08 ENCOUNTER — Ambulatory Visit (HOSPITAL_COMMUNITY)
Admission: RE | Admit: 2020-02-08 | Discharge: 2020-02-08 | Disposition: A | Discharge status: Home / Self Care | Payer: PPO | Source: Ambulatory Visit | Attending: Cardiology | Admitting: Cardiology

## 2020-02-08 DIAGNOSIS — I209 Angina pectoris, unspecified: Secondary | ICD-10-CM | POA: Diagnosis not present

## 2020-02-08 MED ORDER — NITROGLYCERIN 0.4 MG SL SUBL: 0.8000 mg | SUBLINGUAL_TABLET | Freq: Once | SUBLINGUAL | Status: AC

## 2020-02-08 MED ORDER — NITROGLYCERIN 0.4 MG SL SUBL
SUBLINGUAL_TABLET | SUBLINGUAL | Status: AC
  Administered 2020-02-08: 0.8 mg via SUBLINGUAL
  Filled 2020-02-08: qty 2

## 2020-02-08 MED ORDER — IOHEXOL 350 MG/ML SOLN
80.0000 mL | Freq: Once | INTRAVENOUS | Status: AC | PRN
  Administered 2020-02-08: 80 mL via INTRAVENOUS

## 2020-02-09 ENCOUNTER — Other Ambulatory Visit: Payer: Self-pay

## 2020-02-09 ENCOUNTER — Ambulatory Visit: Payer: PPO

## 2020-02-09 DIAGNOSIS — I10 Essential (primary) hypertension: Secondary | ICD-10-CM | POA: Diagnosis not present

## 2020-02-09 DIAGNOSIS — I209 Angina pectoris, unspecified: Secondary | ICD-10-CM | POA: Diagnosis not present

## 2020-02-15 NOTE — Progress Notes (Signed)
No

## 2020-02-17 ENCOUNTER — Ambulatory Visit: Payer: PPO | Admitting: Cardiology

## 2020-03-17 DIAGNOSIS — Z23 Encounter for immunization: Secondary | ICD-10-CM | POA: Diagnosis not present

## 2020-03-19 DIAGNOSIS — R634 Abnormal weight loss: Secondary | ICD-10-CM | POA: Diagnosis not present

## 2020-03-19 DIAGNOSIS — R110 Nausea: Secondary | ICD-10-CM | POA: Diagnosis not present

## 2020-03-19 DIAGNOSIS — R748 Abnormal levels of other serum enzymes: Secondary | ICD-10-CM | POA: Diagnosis not present

## 2020-03-19 DIAGNOSIS — K219 Gastro-esophageal reflux disease without esophagitis: Secondary | ICD-10-CM | POA: Diagnosis not present

## 2020-03-19 DIAGNOSIS — R198 Other specified symptoms and signs involving the digestive system and abdomen: Secondary | ICD-10-CM | POA: Diagnosis not present

## 2020-03-29 DIAGNOSIS — E785 Hyperlipidemia, unspecified: Secondary | ICD-10-CM | POA: Diagnosis not present

## 2020-04-23 ENCOUNTER — Other Ambulatory Visit (HOSPITAL_BASED_OUTPATIENT_CLINIC_OR_DEPARTMENT_OTHER): Payer: Self-pay | Admitting: Internal Medicine

## 2020-04-23 ENCOUNTER — Ambulatory Visit: Payer: PPO | Attending: Internal Medicine

## 2020-04-23 DIAGNOSIS — Z23 Encounter for immunization: Secondary | ICD-10-CM

## 2020-04-27 MED FILL — PFIZER-BIONTECH COVID-19 VA: 30 | 1 days supply | Qty: 0 | Fill #0

## 2020-05-22 ENCOUNTER — Other Ambulatory Visit: Payer: PPO

## 2020-05-22 ENCOUNTER — Encounter: Payer: Self-pay | Admitting: Gastroenterology

## 2020-05-22 ENCOUNTER — Ambulatory Visit: Payer: PPO | Admitting: Gastroenterology

## 2020-05-22 VITALS — BP 110/78 | HR 66 | Ht 71.0 in | Wt 198.0 lb

## 2020-05-22 DIAGNOSIS — R143 Flatulence: Secondary | ICD-10-CM

## 2020-05-22 DIAGNOSIS — R140 Abdominal distension (gaseous): Secondary | ICD-10-CM | POA: Diagnosis not present

## 2020-05-22 DIAGNOSIS — K529 Noninfective gastroenteritis and colitis, unspecified: Secondary | ICD-10-CM | POA: Diagnosis not present

## 2020-05-22 MED ORDER — DICYCLOMINE HCL 10 MG PO CAPS: 10.0000 mg | ORAL_CAPSULE | Freq: Four times a day (QID) | ORAL | 0 refills | Status: DC | PRN

## 2020-05-22 NOTE — Progress Notes (Signed)
[facility] Consult Note:  History: Jennifer Bradley 05/22/2020  Referring provider: Rodrigo Ran, MD  Reason for consult/chief complaint: Diarrhea (pt reports intermittent episodes of loose stools sometimes accompanied by nausea; denies blood or mucous in her stool; doesn't seem conncected to anything Jennifer Bradley eats)   Subjective  HPI:  Jennifer Bradley was sent to me by primary care for chronic diarrhea. For about the last 9 months Jennifer Bradley has had bloating and increased flatulence which Jennifer Bradley does not recall having been problem before.  It is embarrassing with at home and in social situations.  Jennifer Bradley is also had episodic diarrhea with urgency and almost incontinence when that occurs.  These episodes occur infrequently, but the last one about 2 months ago.  Jennifer Bradley did not notice any clear triggers such as diet changes, and had no medicine changes prior to onset of this.  Jennifer Bradley had no sick contacts or antibiotic use that Jennifer Bradley recalls.  Jennifer Bradley thought primary care noticed some recent weight loss but then Jennifer Bradley feels that Jennifer Bradley gained it back.  Denies rectal bleeding or black tarry stool. Normal colonoscopy May 2018 - good prep (prior exam in 2012 with fair prep)  ROS:  Review of Systems  Constitutional: Negative for appetite change and unexpected weight change.  HENT: Negative for mouth sores and voice change.   Eyes: Negative for pain and redness.  Respiratory: Negative for cough and shortness of breath.   Cardiovascular: Negative for chest pain and palpitations.  Genitourinary: Negative for dysuria and hematuria.  Musculoskeletal: Positive for arthralgias. Negative for myalgias.  Skin: Negative for pallor and rash.  Neurological: Negative for weakness and headaches.  Hematological: Negative for adenopathy.  Jennifer Bradley does not feel there is any anxiety component to these episodes of urgency and loose stool.   Past Medical History: Past Medical History:  Diagnosis Date  . Allergy   . Cataract   . COPD  (chronic obstructive pulmonary disease) ([facility])   . GERD (gastroesophageal reflux disease)   . Hyperlipidemia   . Hypertension   . PONV (postoperative nausea and vomiting)   . Thyroid disease      Past Surgical History: Past Surgical History:  Procedure Laterality Date  . APPENDECTOMY    . ARTHRODESIS METATARSALPHALANGEAL JOINT (MTPJ) Left 07/07/2019   Procedure: Left hallux metatarsal phalangeal joint arthrodesis;  Surgeon: Toni Arthurs, MD;  Location: [facility];  Service: Orthopedics;  Laterality: Left;  . BLADDER SUSPENSION    . broken wrist Left   . BUNIONECTOMY Left   . CHOLECYSTECTOMY    . HARDWARE REMOVAL Left 07/07/2019   Procedure: Hardware removal left foot;  Surgeon: Toni Arthurs, MD;  Location: [facility];  Service: Orthopedics;  Laterality: Left;  . THYROIDECTOMY       Family History: Family History  Problem Relation Age of Onset  . Heart attack Mother   . Stroke Mother   . Healthy Father   . Dementia Brother   . Heart attack Brother   . Colon cancer Neg Hx   . Esophageal cancer Neg Hx   . Rectal cancer Neg Hx   . Stomach cancer Neg Hx     Social History: Social History   Socioeconomic History  . Marital status: Married    Spouse name: Not on file  . Number of children: 3  . Years of education: Not on file  . Highest education level: Not on file  Occupational History  . Not on file  Tobacco Use  . Smoking  status: Former Smoker    Packs/day: 0.75    Years: 20.00    Pack years: 15.00    Types: Cigarettes    Quit date: 2019    Years since quitting: 2.9  . Smokeless tobacco: Never Used  Vaping Use  . Vaping Use: Never used  Substance and Sexual Activity  . Alcohol use: Yes    Comment: occasional wine  . Drug use: No  . Sexual activity: Not on file  Other Topics Concern  . Not on file  Social History Narrative  . Not on file   Social Determinants of Health   Financial Resource Strain:   . Difficulty of Paying  Living Expenses: Not on file  Food Insecurity:   . Worried About Programme researcher, broadcasting/film/video in the Last Year: Not on file  . Ran Out of Food in the Last Year: Not on file  Transportation Needs:   . Lack of Transportation (Medical): Not on file  . Lack of Transportation (Non-Medical): Not on file  Physical Activity:   . Days of Exercise per Week: Not on file  . Minutes of Exercise per Session: Not on file  Stress:   . Feeling of Stress : Not on file  Social Connections:   . Frequency of Communication with Friends and Family: Not on file  . Frequency of Social Gatherings with Friends and Family: Not on file  . Attends Religious Services: Not on file  . Active Member of Clubs or Organizations: Not on file  . Attends Banker Meetings: Not on file  . Marital Status: Not on file    Allergies: Allergies  Allergen Reactions  . Penicillins Rash    Outpatient Meds: Current Outpatient Medications  Medication Sig Dispense Refill  . hydrochlorothiazide (HYDRODIURIL) 25 MG tablet 25 mg.   3  . metoprolol tartrate (LOPRESSOR) 100 MG tablet Take 1 tablet (100 mg total) by mouth 2 (two) times daily. 60 tablet 2  . olmesartan (BENICAR) 20 MG tablet Take 20 mg by mouth daily.    . pantoprazole (PROTONIX) 40 MG tablet 40 mg daily.  11  . rosuvastatin (CRESTOR) 20 MG tablet Take 20 mg by mouth daily.   11  . SYNTHROID 137 MCG tablet 137 mcg.   0  . Tiotropium Bromide Monohydrate (SPIRIVA RESPIMAT) 2.5 MCG/ACT AERS Inhale 2 puffs into the lungs daily. 4 g 6   No current facility-administered medications for this visit.    Jennifer Bradley reports this has been a stable medicine list for her for years.  ___________________________________________________________________ Objective   Exam:  BP 110/78   Pulse 66   Ht 5\' 11"  (1.803 m)   Wt 198 lb (89.8 kg)   BMI 27.62 kg/m    General: Well-appearing  Eyes: sclera anicteric, no redness  ENT: oral mucosa moist without lesions, no cervical or  supraclavicular lymphadenopathy  CV: RRR without murmur, S1/S2, no JVD, no peripheral edema  Resp: clear to auscultation bilaterally, normal RR and effort noted  GI: soft,  mild focal LLQ tenderness, with active bowel sounds. No guarding or palpable organomegaly noted.  Skin; warm and dry, no rash or jaundice noted  Neuro: awake, alert and oriented x 3. Normal gross motor function and fluent speech  Labs:  No recent data.  Jennifer Bradley will recent labs were done at primary care in the last couple of months, so we will request them.  Assessment: Encounter Diagnoses  Name Primary?  . Chronic diarrhea Yes  . Abdominal bloating   .  Flatulence     About 9 months of bloating and increased gas without any other clear triggers or medicine/lifestyle changes.  Jennifer Bradley is very concerned about the episodes of urgency and loose stool because they are quite distressing even if infrequent.  Jennifer Bradley also does not know what triggers them.  Jennifer Bradley tends most often to constipation, which is been the case for her many years.  Sometimes Jennifer Bradley will have a daily bowel movement, but most often it is every 2 or 3 days.  Puzzling scenario.  The infrequent and episodic nature of it seems to speak against medication side effect, chronic inflammatory condition, infection, neither is it typical for or did Jennifer Bradley have risk factors for EPI or SIBO. Also does not seem likely to be bile acid diarrhea given its infrequent nature and how long ago her surgery was.  Since there is underlying bloating and gas, I wonder whether Jennifer Bradley may have maldigestion such as lactose intolerance or other food triggers. Plan: Celiac labs, stool for C diff and O/P.  Infection seems unlikely but we should check for sure. As needed dicyclomine for "episodes" Diet advice, especially try 7-10 days off lactose   Further advice to follow results  Thank you for the courtesy of this consult.  Please call me with any questions or concerns.  Charlie Pitter III  CC:  Referring provider noted above

## 2020-05-22 NOTE — Patient Instructions (Signed)
If you are age 66 or older, your body mass index should be between 23-30. Your Body mass index is 27.62 kg/m. If this is out of the aforementioned range listed, please consider follow up with your Primary Care Provider.  If you are age 34 or younger, your body mass index should be between 19-25. Your Body mass index is 27.62 kg/m. If this is out of the aformentioned range listed, please consider follow up with your Primary Care Provider.   Your provider has requested that you go to the basement level for lab work before leaving today. Press "B" on the elevator. The lab is located at the first door on the left as you exit the elevator.  Due to recent changes in healthcare laws, you may see the results of your imaging and laboratory studies on MyChart before your provider has had a chance to review them.  We understand that in some cases there may be results that are confusing or concerning to you. Not all laboratory results come back in the same time frame and the provider may be waiting for multiple results in order to interpret others.  Please give [place] 48 hours in order for your provider to thoroughly review all the results before contacting the office for clarification of your results.   Food Guidelines for those with chronic digestive trouble:  Many people have difficulty digesting certain foods, causing a variety of distressing and embarrassing symptoms such as abdominal pain, bloating and gas.  These foods may need to be avoided or consumed in small amounts.  Here are some tips that might be helpful for you.  1.   Lactose intolerance is the difficulty or complete inability to digest lactose, the natural sugar in milk and anything made from milk.  This condition is harmless, common, and can begin any time during life.  Some people can digest a modest amount of lactose while others cannot tolerate any.  Also, not all dairy products contain equal amounts of lactose.  For example, hard cheeses such as  parmesan have less lactose than soft cheeses such as cheddar.  Yogurt has less lactose than milk or cheese.  Many packaged foods (even many brands of bread) have milk, so read ingredient lists carefully.  It is difficult to test for lactose intolerance, so just try avoiding lactose as much as possible for a week and see what happens with your symptoms.  If you seem to be lactose intolerant, the best plan is to avoid it (but make sure you get calcium from another source).  The next best thing is to use lactase enzyme supplements, available over the counter everywhere.  Just know that many lactose intolerant people need to take several tablets with each serving of dairy to avoid symptoms.  Lastly, a lot of restaurant food is made with milk or butter.  Many are things you might not suspect, such as mashed potatoes, rice and pasta (cooked with butter) and "grilled" items.  If you are lactose intolerant, it never hurts to ask your server what has milk or butter.  2.   Fiber is an important part of your diet, but not all fiber is well-tolerated.  Insoluble fiber such as bran is often consumed by normal gut bacteria and converted into gas.  Soluble fiber such as oats, squash, carrots and green beans are typically tolerated better.  3.   Some types of carbohydrates can be poorly digested.  Examples include: fructose (apples, cherries, pears, raisins and other dried fruits),  fructans (onions, zucchini, large amounts of wheat), sorbitol/mannitol/xylitol and sucralose/Splenda (common artificial sweeteners), and raffinose (lentils, broccoli, cabbage, asparagus, brussel sprouts, many types of beans).  Do a Programmer, multimedia for [employer] [place] and you will find helpful information. Beano, a dietary supplement, will often help with raffinose-containing foods.  As with lactase tablets, you may need several per serving.  4.   Whenever possible, avoid processed food&meats and chemical additives.  High fructose corn syrup, a common  sweetener, may be difficult to digest.  Eggs and soy (comes from the soybean, and added to many foods now) are other common bloating/gassy foods.  5.  Regarding gluten:  gluten is a protein mainly found in wheat, but also rye and barley.  There is a condition called celiac sprue, which is an inflammatory reaction in the small intestine causing a variety of digestive symptoms.  Blood testing is highly reliable to look for this condition, and sometimes upper endoscopy with small bowel biopsies may be necessary to make the diagnosis.  Many patients who test negative for celiac sprue report improvement in their digestive symptoms when they switch to a gluten-free diet.  However, in these "non-celiac gluten sensitive" patients, the true role of gluten in their symptoms is unclear.  Reducing carbohydrates in general may decrease the gas and bloating caused when gut bacteria consume carbs. Also, some of these patients may actually be intolerant of the baker's yeast in bread products rather than the gluten.  Flatbread and other reduced yeast breads might therefore be tolerated.  There is no specific testing available for most food intolerances, which are discovered mainly by dietary elimination.  Please do not embark on a gluten free diet unless directed by your doctor, as it is highly restrictive, and may lead to nutritional deficiencies if not carefully monitored.  Lastly, beware of internet claims offering "personalized" tests for food intolerances.  Such testing has no reliable scientific evidence to support its reliability and correlation to symptoms.    6.  The best advice is old advice, especially for those with chronic digestive trouble - try to eat "clean".  Balanced diet, avoid processed food, plenty of fruits and vegetables, cut down the sugar, minimal alcohol, avoid tobacco. Make time to care for yourself, get enough sleep, exercise when you can, reduce stress.  Your guts will thank you for it.   - Dr.  [facility]

## 2020-05-23 LAB — TISSUE TRANSGLUTAMINASE, IGA: (tTG) Ab, IgA: <1 U/mL

## 2020-05-23 LAB — IGA: Immunoglobulin A: 192 mg/dL (ref 70–320)

## 2020-05-24 ENCOUNTER — Other Ambulatory Visit: Payer: PPO

## 2020-05-24 DIAGNOSIS — K529 Noninfective gastroenteritis and colitis, unspecified: Secondary | ICD-10-CM

## 2020-05-24 DIAGNOSIS — R143 Flatulence: Secondary | ICD-10-CM

## 2020-05-24 DIAGNOSIS — R140 Abdominal distension (gaseous): Secondary | ICD-10-CM

## 2020-05-25 LAB — CLOSTRIDIUM DIFFICILE TOXIN B, QUALITATIVE, REAL-TIME PCR: Toxigenic C. Difficile by PCR: NOT DETECTED

## 2020-05-26 LAB — CLOSTRIDIUM DIFFICILE EIA: C difficile Toxins A+B, EIA: NEGATIVE

## 2020-06-04 ENCOUNTER — Other Ambulatory Visit: Payer: Self-pay

## 2020-06-04 DIAGNOSIS — R140 Abdominal distension (gaseous): Secondary | ICD-10-CM

## 2020-06-04 DIAGNOSIS — K529 Noninfective gastroenteritis and colitis, unspecified: Secondary | ICD-10-CM

## 2020-06-04 DIAGNOSIS — R143 Flatulence: Secondary | ICD-10-CM

## 2020-06-27 DIAGNOSIS — N83202 Unspecified ovarian cyst, left side: Secondary | ICD-10-CM | POA: Diagnosis not present

## 2020-06-27 DIAGNOSIS — Z1231 Encounter for screening mammogram for malignant neoplasm of breast: Secondary | ICD-10-CM | POA: Diagnosis not present

## 2020-06-27 DIAGNOSIS — Z6828 Body mass index (BMI) 28.0-28.9, adult: Secondary | ICD-10-CM | POA: Diagnosis not present

## 2020-06-27 DIAGNOSIS — Z01419 Encounter for gynecological examination (general) (routine) without abnormal findings: Secondary | ICD-10-CM | POA: Diagnosis not present

## 2020-06-27 DIAGNOSIS — Z124 Encounter for screening for malignant neoplasm of cervix: Secondary | ICD-10-CM | POA: Diagnosis not present

## 2020-06-28 ENCOUNTER — Other Ambulatory Visit: Payer: PPO

## 2020-06-28 DIAGNOSIS — R143 Flatulence: Secondary | ICD-10-CM | POA: Diagnosis not present

## 2020-06-28 DIAGNOSIS — R140 Abdominal distension (gaseous): Secondary | ICD-10-CM

## 2020-06-28 DIAGNOSIS — K529 Noninfective gastroenteritis and colitis, unspecified: Secondary | ICD-10-CM

## 2020-07-04 LAB — OVA AND PARASITE EXAMINATION
CONCENTRATE RESULT:: NONE SEEN
MICRO NUMBER:: 11414996
SPECIMEN QUALITY:: ADEQUATE
TRICHROME RESULT:: NONE SEEN

## 2020-08-06 DIAGNOSIS — E039 Hypothyroidism, unspecified: Secondary | ICD-10-CM | POA: Diagnosis not present

## 2020-08-06 DIAGNOSIS — R7301 Impaired fasting glucose: Secondary | ICD-10-CM | POA: Diagnosis not present

## 2020-09-12 DIAGNOSIS — H40013 Open angle with borderline findings, low risk, bilateral: Secondary | ICD-10-CM | POA: Diagnosis not present

## 2020-10-01 ENCOUNTER — Other Ambulatory Visit: Payer: Self-pay | Admitting: Cardiology

## 2020-10-01 DIAGNOSIS — I10 Essential (primary) hypertension: Secondary | ICD-10-CM

## 2020-10-03 ENCOUNTER — Other Ambulatory Visit: Payer: Self-pay | Admitting: Cardiology

## 2020-10-03 DIAGNOSIS — I10 Essential (primary) hypertension: Secondary | ICD-10-CM

## 2021-01-25 DIAGNOSIS — E785 Hyperlipidemia, unspecified: Secondary | ICD-10-CM | POA: Diagnosis not present

## 2021-01-25 DIAGNOSIS — E039 Hypothyroidism, unspecified: Secondary | ICD-10-CM | POA: Diagnosis not present

## 2021-01-25 DIAGNOSIS — R7301 Impaired fasting glucose: Secondary | ICD-10-CM | POA: Diagnosis not present

## 2021-02-01 DIAGNOSIS — J449 Chronic obstructive pulmonary disease, unspecified: Secondary | ICD-10-CM | POA: Diagnosis not present

## 2021-02-01 DIAGNOSIS — Z79899 Other long term (current) drug therapy: Secondary | ICD-10-CM | POA: Diagnosis not present

## 2021-02-01 DIAGNOSIS — E778 Other disorders of glycoprotein metabolism: Secondary | ICD-10-CM | POA: Diagnosis not present

## 2021-02-01 DIAGNOSIS — R82998 Other abnormal findings in urine: Secondary | ICD-10-CM | POA: Diagnosis not present

## 2021-02-01 DIAGNOSIS — M8580 Other specified disorders of bone density and structure, unspecified site: Secondary | ICD-10-CM | POA: Diagnosis not present

## 2021-02-01 DIAGNOSIS — E739 Lactose intolerance, unspecified: Secondary | ICD-10-CM | POA: Diagnosis not present

## 2021-02-01 DIAGNOSIS — E785 Hyperlipidemia, unspecified: Secondary | ICD-10-CM | POA: Diagnosis not present

## 2021-02-01 DIAGNOSIS — Z1212 Encounter for screening for malignant neoplasm of rectum: Secondary | ICD-10-CM | POA: Diagnosis not present

## 2021-02-01 DIAGNOSIS — Z0000 Encounter for general adult medical examination without abnormal findings: Secondary | ICD-10-CM | POA: Diagnosis not present

## 2021-02-01 DIAGNOSIS — I10 Essential (primary) hypertension: Secondary | ICD-10-CM | POA: Diagnosis not present

## 2021-02-01 DIAGNOSIS — Z8249 Family history of ischemic heart disease and other diseases of the circulatory system: Secondary | ICD-10-CM | POA: Diagnosis not present

## 2021-02-01 DIAGNOSIS — E039 Hypothyroidism, unspecified: Secondary | ICD-10-CM | POA: Diagnosis not present

## 2021-02-01 DIAGNOSIS — N839 Noninflammatory disorder of ovary, fallopian tube and broad ligament, unspecified: Secondary | ICD-10-CM | POA: Diagnosis not present

## 2021-02-01 DIAGNOSIS — H9190 Unspecified hearing loss, unspecified ear: Secondary | ICD-10-CM | POA: Diagnosis not present

## 2021-02-11 DIAGNOSIS — Z1212 Encounter for screening for malignant neoplasm of rectum: Secondary | ICD-10-CM | POA: Diagnosis not present

## 2021-02-12 DIAGNOSIS — H903 Sensorineural hearing loss, bilateral: Secondary | ICD-10-CM | POA: Diagnosis not present

## 2021-02-19 DIAGNOSIS — R748 Abnormal levels of other serum enzymes: Secondary | ICD-10-CM | POA: Diagnosis not present

## 2021-03-11 DIAGNOSIS — H903 Sensorineural hearing loss, bilateral: Secondary | ICD-10-CM | POA: Diagnosis not present

## 2021-03-28 DIAGNOSIS — M25561 Pain in right knee: Secondary | ICD-10-CM | POA: Diagnosis not present

## 2021-04-04 ENCOUNTER — Ambulatory Visit: Payer: Self-pay

## 2021-04-04 ENCOUNTER — Encounter: Payer: Self-pay | Admitting: Orthopaedic Surgery

## 2021-04-04 ENCOUNTER — Ambulatory Visit: Payer: PPO | Admitting: Orthopaedic Surgery

## 2021-04-04 ENCOUNTER — Other Ambulatory Visit: Payer: Self-pay

## 2021-04-04 VITALS — Ht 71.0 in | Wt 198.0 lb

## 2021-04-04 DIAGNOSIS — M25561 Pain in right knee: Secondary | ICD-10-CM | POA: Diagnosis not present

## 2021-04-04 DIAGNOSIS — G8929 Other chronic pain: Secondary | ICD-10-CM

## 2021-04-04 MED ORDER — BUPIVACAINE HCL 0.5 % IJ SOLN
2.0000 mL | INTRAMUSCULAR | Status: AC | PRN
  Administered 2021-04-04: 2 mL via INTRA_ARTICULAR

## 2021-04-04 MED ORDER — LIDOCAINE HCL 1 % IJ SOLN
2.0000 mL | INTRAMUSCULAR | Status: AC | PRN
  Administered 2021-04-04: 2 mL

## 2021-04-04 MED ORDER — METHYLPREDNISOLONE ACETATE 40 MG/ML IJ SUSP
40.0000 mg | INTRAMUSCULAR | Status: AC | PRN
  Administered 2021-04-04: 40 mg via INTRA_ARTICULAR

## 2021-04-04 NOTE — Progress Notes (Signed)
Office Visit Note   Patient: Jennifer Bradley           Date of Birth: 24-Aug-1955           MRN: [id number] Visit Date: 04/04/2021              Requested by: Rodrigo Ran, MD [address] [place],  [place] [address] PCP: Rodrigo Ran, MD   Assessment & Plan: Visit Diagnoses:  1. Chronic pain of right knee     Plan: Ms. Bickert does have large joint effusion that we aspirated today.  40 cc of bright yellowish-greenish fluid was obtained which was sent to the lab.  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  Cortisone was injected as well.  Patient instructed to notify me in a couple weeks if she does not notice any improvement or if the injection is temporary.  Follow-Up Instructions: No follow-ups on file.   Orders:  Orders Placed This Encounter  Procedures   Anaerobic and Aerobic Culture   XR KNEE 3 VIEW RIGHT   Cell count + diff,  w/ cryst-synvl fld   No orders of the defined types were placed in this encounter.     Procedures: Large Joint Inj: R knee on 04/04/2021 9:55 PM Indications: pain Details: 22 G needle  Arthrogram: No  Medications: 40 mg methylPREDNISolone acetate 40 MG/ML; 2 mL lidocaine 1 %; 2 mL bupivacaine 0.5 % Consent was given by the patient. Patient was prepped and draped in the usual sterile fashion.      Clinical Data: No additional findings.   Subjective: Chief Complaint  Patient presents with   Right Knee - Pain    Jennifer Bradley is a very pleasant 65 year old female mother-in-law of Enos Fling who comes in to see me for right knee pain for a few weeks without any injuries.  She endorses swelling and throbbing pain.  She is having a lot of trouble using steps and getting into a car.  She has chronic nighttime pain preventing her from sleeping.  Denies any history of gout or fevers or chills.  She had a knee scope about 30 years ago in [place] for swelling.   Review of Systems  Constitutional: Negative.   HENT: Negative.    Eyes: Negative.   Respiratory:  Negative.    Cardiovascular: Negative.   Endocrine: Negative.   Musculoskeletal: Negative.   Neurological: Negative.   Hematological: Negative.   Psychiatric/Behavioral: Negative.    All other systems reviewed and are negative.   Objective: Vital Signs: Ht 5\' 11"  (1.803 m)   Wt 198 lb (89.8 kg)   BMI 27.62 kg/m   Physical Exam Vitals and nursing note reviewed.  Constitutional:      Appearance: She is well-developed.  Pulmonary:     Effort: Pulmonary effort is normal.  Skin:    General: Skin is warm.     Capillary Refill: Capillary refill takes less than 2 seconds.  Neurological:     Mental Status: She is alert and oriented to person, place, and time.  Psychiatric:        Behavior: Behavior normal.        Thought Content: Thought content normal.        Judgment: Judgment normal.    Ortho Exam  Right knee exam shows moderate joint effusion with limited and painful range of motion.  Collaterals and cruciates are stable.  Slight tenderness along the medial tibial plateau.  No evidence of infection.  No real warmth or skin changes.  Specialty Comments:  No specialty comments available.  Imaging: XR KNEE 3 VIEW RIGHT  Result Date: 04/04/2021 No acute or structural abnormalities    PMFS History: Patient Active Problem List   Diagnosis Date Noted   Angina pectoris (HCC) 01/24/2020   Family history of early CAD 01/24/2020   COPD, mild (HCC) 10/10/2019   Shortness of breath 07/25/2019   Hypertension    Hyperlipidemia    GERD (gastroesophageal reflux disease)    Past Medical History:  Diagnosis Date   Allergy    Cataract    COPD (chronic obstructive pulmonary disease) (HCC)    GERD (gastroesophageal reflux disease)    Hyperlipidemia    Hypertension    PONV (postoperative nausea and vomiting)    Thyroid disease     Family History  Problem Relation Age of Onset   Heart attack Mother    Stroke Mother    Healthy Father    Dementia Brother    Heart attack  Brother    Colon cancer Neg Hx    Esophageal cancer Neg Hx    Rectal cancer Neg Hx    Stomach cancer Neg Hx     Past Surgical History:  Procedure Laterality Date   APPENDECTOMY     ARTHRODESIS METATARSALPHALANGEAL JOINT (MTPJ) Left 07/07/2019   Procedure: Left hallux metatarsal phalangeal joint arthrodesis;  Surgeon: Toni Arthurs, MD;  Location: [facility];  Service: Orthopedics;  Laterality: Left;   BLADDER SUSPENSION     broken wrist Left    BUNIONECTOMY Left    CHOLECYSTECTOMY     HARDWARE REMOVAL Left 07/07/2019   Procedure: Hardware removal left foot;  Surgeon: Toni Arthurs, MD;  Location:  SURGERY CENTER;  Service: Orthopedics;  Laterality: Left;   THYROIDECTOMY     Social History   Occupational History   Not on file  Tobacco Use   Smoking status: Former    Packs/day: 0.75    Years: 20.00    Pack years: 15.00    Types: Cigarettes    Quit date: 2019    Years since quitting: 3.8   Smokeless tobacco: Never  Vaping Use   Vaping Use: Never used  Substance and Sexual Activity   Alcohol use: Yes    Comment: occasional wine   Drug use: No   Sexual activity: Not on file

## 2021-04-10 LAB — SYNOVIAL FLUID ANALYSIS, COMPLETE
Basophils, %: 0 %
Eosinophils-Synovial: 0 % (ref 0–2)
Lymphocytes-Synovial Fld: 53 % (ref 0–74)
Monocyte/Macrophage: 16 % (ref 0–69)
Neutrophil, Synovial: 31 % — ABNORMAL HIGH (ref 0–24)
Synoviocytes, %: 0 % (ref 0–15)
WBC, Synovial: 3529 cells/uL — ABNORMAL HIGH (ref <150)

## 2021-04-10 LAB — ANAEROBIC AND AEROBIC CULTURE
AER RESULT:: NO GROWTH
MICRO NUMBER:: 12529263
MICRO NUMBER:: 12529264
SPECIMEN QUALITY:: ADEQUATE
SPECIMEN QUALITY:: ADEQUATE

## 2021-04-15 DIAGNOSIS — I10 Essential (primary) hypertension: Secondary | ICD-10-CM | POA: Diagnosis not present

## 2021-04-15 DIAGNOSIS — J449 Chronic obstructive pulmonary disease, unspecified: Secondary | ICD-10-CM | POA: Diagnosis not present

## 2021-04-15 DIAGNOSIS — E039 Hypothyroidism, unspecified: Secondary | ICD-10-CM | POA: Diagnosis not present

## 2021-04-15 DIAGNOSIS — M8580 Other specified disorders of bone density and structure, unspecified site: Secondary | ICD-10-CM | POA: Diagnosis not present

## 2021-04-26 ENCOUNTER — Encounter: Payer: Self-pay | Admitting: Orthopaedic Surgery

## 2021-04-26 ENCOUNTER — Ambulatory Visit: Payer: PPO | Admitting: Orthopaedic Surgery

## 2021-04-26 ENCOUNTER — Other Ambulatory Visit: Payer: Self-pay

## 2021-04-26 DIAGNOSIS — G8929 Other chronic pain: Secondary | ICD-10-CM | POA: Diagnosis not present

## 2021-04-26 DIAGNOSIS — M25561 Pain in right knee: Secondary | ICD-10-CM | POA: Diagnosis not present

## 2021-04-26 NOTE — Progress Notes (Signed)
Office Visit Note   Patient: Jennifer Bradley           Date of Birth: 1955-07-03           MRN: [id number] Visit Date: 04/26/2021              Requested by: Rodrigo Ran, MD [address] [place],  [place] [address] PCP: Rodrigo Ran, MD   Assessment & Plan: Visit Diagnoses:  1. Chronic pain of right knee     Plan: Impression is chronic right knee pain with very temporary relief from cortisone injection.  Patient did mention strong family history of rheumatoid arthritis.  We will obtain arthritis panel today as well as MRI of the right knee to rule out structural abnormalities.  I will be in touch with the patient regarding the results.  Follow-Up Instructions: No follow-ups on file.   Orders:  Orders Placed This Encounter  Procedures   MR Knee Right w/o contrast   No orders of the defined types were placed in this encounter.     Procedures: No procedures performed   Clinical Data: No additional findings.   Subjective: Chief Complaint  Patient presents with   Right Knee - Pain, Follow-up    HPI  Jennifer Bradley returns today for chronic right knee pain.  She states that previous cortisone injection gave her about a day or 2 for relief.  She has a sensation of giving way secondary to pain.  She has increased pain when going up and down steps and getting up out of bed in the morning.  Denies any mechanical symptoms.  Review of Systems   Objective: Vital Signs: There were no vitals taken for this visit.  Physical Exam  Ortho Exam  Right knee shows a small effusion.  Crepitus with range of motion.  Exam is otherwise unremarkable.  No evidence of infection.  Specialty Comments:  No specialty comments available.  Imaging: No results found.   PMFS History: Patient Active Problem List   Diagnosis Date Noted   Angina pectoris (HCC) 01/24/2020   Family history of early CAD 01/24/2020   COPD, mild (HCC) 10/10/2019   Shortness of breath 07/25/2019   Hypertension     Hyperlipidemia    GERD (gastroesophageal reflux disease)    Past Medical History:  Diagnosis Date   Allergy    Cataract    COPD (chronic obstructive pulmonary disease) (HCC)    GERD (gastroesophageal reflux disease)    Hyperlipidemia    Hypertension    PONV (postoperative nausea and vomiting)    Thyroid disease     Family History  Problem Relation Age of Onset   Heart attack Mother    Stroke Mother    Healthy Father    Dementia Brother    Heart attack Brother    Colon cancer Neg Hx    Esophageal cancer Neg Hx    Rectal cancer Neg Hx    Stomach cancer Neg Hx     Past Surgical History:  Procedure Laterality Date   APPENDECTOMY     ARTHRODESIS METATARSALPHALANGEAL JOINT (MTPJ) Left 07/07/2019   Procedure: Left hallux metatarsal phalangeal joint arthrodesis;  Surgeon: Toni Arthurs, MD;  Location: [facility];  Service: Orthopedics;  Laterality: Left;   BLADDER SUSPENSION     broken wrist Left    BUNIONECTOMY Left    CHOLECYSTECTOMY     HARDWARE REMOVAL Left 07/07/2019   Procedure: Hardware removal left foot;  Surgeon: Toni Arthurs, MD;  Location:   SUR[facility]ERY C[facility];  Service: Orthopedics;  Laterality: Left;   THYROIDECTOMY     Social History   Occupational History   Not on file  Tobacco Use   Smoking status: Former    Packs/day: 0.75    Years: 20.00    Pack years: 15.00    Types: Cigarettes    Quit date: 2019    Years since quitting: 3.8   Smokeless tobacco: Never  Vaping Use   Vaping Use: Never used  Substance and Sexual Activity   Alcohol use: Yes    Comment: occasional wine   Drug use: No   Sexual activity: Not on file

## 2021-04-26 NOTE — Addendum Note (Signed)
Addended by: Albertina Parr on: 04/26/2021 10:12 AM   Modules accepted: Orders

## 2021-04-28 ENCOUNTER — Other Ambulatory Visit: Payer: Self-pay

## 2021-04-28 ENCOUNTER — Ambulatory Visit
Admission: RE | Admit: 2021-04-28 | Discharge: 2021-04-28 | Disposition: A | Discharge status: Home / Self Care | Payer: PPO | Source: Ambulatory Visit | Attending: Orthopaedic Surgery | Admitting: Orthopaedic Surgery

## 2021-04-28 DIAGNOSIS — M25561 Pain in right knee: Secondary | ICD-10-CM | POA: Diagnosis not present

## 2021-04-28 DIAGNOSIS — G8929 Other chronic pain: Secondary | ICD-10-CM

## 2021-04-28 NOTE — Progress Notes (Signed)
Spoke with patient about MRI.  Will let patient know about blood work when available

## 2021-04-29 ENCOUNTER — Encounter: Payer: Self-pay | Admitting: Orthopaedic Surgery

## 2021-04-29 LAB — SEDIMENTATION RATE: Sed Rate: 17 mm/h (ref 0–30)

## 2021-04-29 LAB — URIC ACID: Uric Acid, Serum: 5.2 mg/dL (ref 2.5–7.0)

## 2021-04-29 LAB — ANA: Anti Nuclear Antibody (ANA): NEGATIVE

## 2021-04-29 LAB — RHEUMATOID FACTOR: Rheumatoid fact SerPl-aCnc: <14 IU/mL (ref <14)

## 2021-04-30 ENCOUNTER — Telehealth: Payer: Self-pay

## 2021-04-30 NOTE — Telephone Encounter (Signed)
APPT MADE

## 2021-04-30 NOTE — Telephone Encounter (Signed)
yes

## 2021-04-30 NOTE — Telephone Encounter (Signed)
Pt called into the office and would like to know if she can come into the office Thursday to have fluid drained off. She stated she is leaving for vacation on Saturday.   Please advise

## 2021-05-02 ENCOUNTER — Other Ambulatory Visit: Payer: Self-pay

## 2021-05-02 ENCOUNTER — Encounter: Payer: Self-pay | Admitting: Orthopaedic Surgery

## 2021-05-02 ENCOUNTER — Ambulatory Visit: Payer: PPO | Admitting: Orthopaedic Surgery

## 2021-05-02 DIAGNOSIS — G8929 Other chronic pain: Secondary | ICD-10-CM

## 2021-05-02 DIAGNOSIS — M25561 Pain in right knee: Secondary | ICD-10-CM | POA: Diagnosis not present

## 2021-05-02 MED ORDER — METHYLPREDNISOLONE ACETATE 40 MG/ML IJ SUSP
40.0000 mg | INTRAMUSCULAR | Status: AC | PRN
  Administered 2021-05-02: 12:00:00 40 mg via INTRA_ARTICULAR

## 2021-05-02 MED ORDER — LIDOCAINE HCL 1 % IJ SOLN
2.0000 mL | INTRAMUSCULAR | Status: AC | PRN
  Administered 2021-05-02: 12:00:00 2 mL

## 2021-05-02 MED ORDER — BUPIVACAINE HCL 0.5 % IJ SOLN
2.0000 mL | INTRAMUSCULAR | Status: AC | PRN
  Administered 2021-05-02: 12:00:00 2 mL via INTRA_ARTICULAR

## 2021-05-02 NOTE — Progress Notes (Signed)
Office Visit Note   Patient: Jennifer Bradley           Date of Birth: 12-Jun-1956           MRN: [id number] Visit Date: 05/02/2021              Requested by: Rodrigo Ran, MD [address] [place],  [place] [address] PCP: Rodrigo Ran, MD   Assessment & Plan: Visit Diagnoses:  1. Chronic pain of right knee     Plan: Impression right knee effusion, OA.  40 cc yellowish synovial fluid aspirated, cortisone injected.  Compression wrap applied.  Activity as tolerated.  Fluid sent to lab.    Follow-Up Instructions: No follow-ups on file.   Orders:  Orders Placed This Encounter  Procedures   Anaerobic and Aerobic Culture   Gram stain   Synovial Fluid Analysis, Complete   No orders of the defined types were placed in this encounter.     Procedures: Large Joint Inj: R knee on 05/02/2021 12:15 PM Indications: pain Details: 22 G needle  Arthrogram: No  Medications: 40 mg methylPREDNISolone acetate 40 MG/ML; 2 mL lidocaine 1 %; 2 mL bupivacaine 0.5 % Consent was given by the patient. Patient was prepped and draped in the usual sterile fashion.      Clinical Data: No additional findings.   Subjective: Chief Complaint  Patient presents with   Right Knee - Pain    HPI  Jennifer Bradley returns today for recurrent right knee effusion and pain.  Arthritis panel was normal.  MRI showed chondromalacia without structural problems.  Leaving for vacation this weekend and would like repeat aspiration and injection for pain relief.    Review of Systems   Objective: Vital Signs: There were no vitals taken for this visit.  Physical Exam  Ortho Exam  Large joint effusion of right knee with painful ROM.  No signs of infection.  Specialty Comments:  No specialty comments available.  Imaging: No results found.   PMFS History: Patient Active Problem List   Diagnosis Date Noted   Angina pectoris (HCC) 01/24/2020   Family history of early CAD 01/24/2020   COPD, mild (HCC) 10/10/2019    Shortness of breath 07/25/2019   Hypertension    Hyperlipidemia    GERD (gastroesophageal reflux disease)    Past Medical History:  Diagnosis Date   Allergy    Cataract    COPD (chronic obstructive pulmonary disease) (HCC)    GERD (gastroesophageal reflux disease)    Hyperlipidemia    Hypertension    PONV (postoperative nausea and vomiting)    Thyroid disease     Family History  Problem Relation Age of Onset   Heart attack Mother    Stroke Mother    Healthy Father    Dementia Brother    Heart attack Brother    Colon cancer Neg Hx    Esophageal cancer Neg Hx    Rectal cancer Neg Hx    Stomach cancer Neg Hx     Past Surgical History:  Procedure Laterality Date   APPENDECTOMY     ARTHRODESIS METATARSALPHALANGEAL JOINT (MTPJ) Left 07/07/2019   Procedure: Left hallux metatarsal phalangeal joint arthrodesis;  Surgeon: Toni Arthurs, MD;  Location: [facility];  Service: Orthopedics;  Laterality: Left;   BLADDER SUSPENSION     broken wrist Left    BUNIONECTOMY Left    CHOLECYSTECTOMY     HARDWARE REMOVAL Left 07/07/2019   Procedure: Hardware removal left foot;  Surgeon: Victorino Dike,  Jonny Ruiz, MD;  Location: [facility];  Service: Orthopedics;  Laterality: Left;   THYROIDECTOMY     Social History   Occupational History   Not on file  Tobacco Use   Smoking status: Former    Packs/day: 0.75    Years: 20.00    Pack years: 15.00    Types: Cigarettes    Quit date: 2019    Years since quitting: 3.8   Smokeless tobacco: Never  Vaping Use   Vaping Use: Never used  Substance and Sexual Activity   Alcohol use: Yes    Comment: occasional wine   Drug use: No   Sexual activity: Not on file

## 2021-05-08 LAB — SYNOVIAL FLUID ANALYSIS, COMPLETE
Basophils, %: 0 %
Eosinophils-Synovial: 0 % (ref 0–2)
Lymphocytes-Synovial Fld: 58 % (ref 0–74)
Monocyte/Macrophage: 16 % (ref 0–69)
Neutrophil, Synovial: 26 % — ABNORMAL HIGH (ref 0–24)
Synoviocytes, %: 0 % (ref 0–15)
WBC, Synovial: 48960 cells/uL — ABNORMAL HIGH (ref <150)

## 2021-05-08 LAB — ANAEROBIC AND AEROBIC CULTURE
AER RESULT:: NO GROWTH
GRAM STAIN:: NONE SEEN
MICRO NUMBER:: 12651689
MICRO NUMBER:: 12651690
SPECIMEN QUALITY:: ADEQUATE
SPECIMEN QUALITY:: ADEQUATE

## 2021-05-15 DIAGNOSIS — E039 Hypothyroidism, unspecified: Secondary | ICD-10-CM | POA: Diagnosis not present

## 2021-05-15 DIAGNOSIS — I10 Essential (primary) hypertension: Secondary | ICD-10-CM | POA: Diagnosis not present

## 2021-05-15 DIAGNOSIS — E785 Hyperlipidemia, unspecified: Secondary | ICD-10-CM | POA: Diagnosis not present

## 2021-05-15 DIAGNOSIS — J449 Chronic obstructive pulmonary disease, unspecified: Secondary | ICD-10-CM | POA: Diagnosis not present

## 2021-06-24 DIAGNOSIS — N649 Disorder of breast, unspecified: Secondary | ICD-10-CM | POA: Diagnosis not present

## 2021-06-24 DIAGNOSIS — N6452 Nipple discharge: Secondary | ICD-10-CM | POA: Diagnosis not present

## 2021-06-24 DIAGNOSIS — N644 Mastodynia: Secondary | ICD-10-CM | POA: Diagnosis not present

## 2021-06-27 ENCOUNTER — Other Ambulatory Visit: Payer: Self-pay | Admitting: Obstetrics & Gynecology

## 2021-06-27 DIAGNOSIS — N6452 Nipple discharge: Secondary | ICD-10-CM

## 2021-07-11 ENCOUNTER — Ambulatory Visit
Admission: RE | Admit: 2021-07-11 | Discharge: 2021-07-11 | Disposition: A | Discharge status: Home / Self Care | Payer: PRIVATE HEALTH INSURANCE | Source: Ambulatory Visit | Attending: Obstetrics & Gynecology | Admitting: Obstetrics & Gynecology

## 2021-07-11 ENCOUNTER — Ambulatory Visit
Admission: RE | Admit: 2021-07-11 | Discharge: 2021-07-11 | Disposition: A | Discharge status: Home / Self Care | Payer: PPO | Source: Ambulatory Visit | Attending: Obstetrics & Gynecology | Admitting: Obstetrics & Gynecology

## 2021-07-11 DIAGNOSIS — R922 Inconclusive mammogram: Secondary | ICD-10-CM | POA: Diagnosis not present

## 2021-07-11 DIAGNOSIS — N6452 Nipple discharge: Secondary | ICD-10-CM

## 2021-07-12 ENCOUNTER — Other Ambulatory Visit: Payer: Self-pay | Admitting: Obstetrics & Gynecology

## 2021-07-12 DIAGNOSIS — R972 Elevated prostate specific antigen [PSA]: Secondary | ICD-10-CM

## 2021-07-14 DIAGNOSIS — E785 Hyperlipidemia, unspecified: Secondary | ICD-10-CM | POA: Diagnosis not present

## 2021-07-14 DIAGNOSIS — I10 Essential (primary) hypertension: Secondary | ICD-10-CM | POA: Diagnosis not present

## 2021-07-14 DIAGNOSIS — J449 Chronic obstructive pulmonary disease, unspecified: Secondary | ICD-10-CM | POA: Diagnosis not present

## 2021-07-14 DIAGNOSIS — E039 Hypothyroidism, unspecified: Secondary | ICD-10-CM | POA: Diagnosis not present

## 2021-07-16 DIAGNOSIS — N83202 Unspecified ovarian cyst, left side: Secondary | ICD-10-CM | POA: Diagnosis not present

## 2021-07-16 DIAGNOSIS — Z01411 Encounter for gynecological examination (general) (routine) with abnormal findings: Secondary | ICD-10-CM | POA: Diagnosis not present

## 2021-07-16 DIAGNOSIS — Z01419 Encounter for gynecological examination (general) (routine) without abnormal findings: Secondary | ICD-10-CM | POA: Diagnosis not present

## 2021-07-16 DIAGNOSIS — Z6829 Body mass index (BMI) 29.0-29.9, adult: Secondary | ICD-10-CM | POA: Diagnosis not present

## 2021-07-16 DIAGNOSIS — Z124 Encounter for screening for malignant neoplasm of cervix: Secondary | ICD-10-CM | POA: Diagnosis not present

## 2021-07-17 ENCOUNTER — Other Ambulatory Visit: Payer: Self-pay | Admitting: Obstetrics & Gynecology

## 2021-07-17 DIAGNOSIS — N6459 Other signs and symptoms in breast: Secondary | ICD-10-CM

## 2021-08-04 ENCOUNTER — Other Ambulatory Visit: Payer: Self-pay | Admitting: Cardiology

## 2021-08-04 DIAGNOSIS — I10 Essential (primary) hypertension: Secondary | ICD-10-CM

## 2021-08-05 DIAGNOSIS — E039 Hypothyroidism, unspecified: Secondary | ICD-10-CM | POA: Diagnosis not present

## 2021-08-05 DIAGNOSIS — I10 Essential (primary) hypertension: Secondary | ICD-10-CM | POA: Diagnosis not present

## 2021-08-05 DIAGNOSIS — J449 Chronic obstructive pulmonary disease, unspecified: Secondary | ICD-10-CM | POA: Diagnosis not present

## 2021-08-05 DIAGNOSIS — F32A Depression, unspecified: Secondary | ICD-10-CM | POA: Diagnosis not present

## 2021-08-05 DIAGNOSIS — Z7689 Persons encountering health services in other specified circumstances: Secondary | ICD-10-CM | POA: Diagnosis not present

## 2021-08-05 DIAGNOSIS — R7301 Impaired fasting glucose: Secondary | ICD-10-CM | POA: Diagnosis not present

## 2021-08-05 DIAGNOSIS — E785 Hyperlipidemia, unspecified: Secondary | ICD-10-CM | POA: Diagnosis not present

## 2021-08-05 DIAGNOSIS — K219 Gastro-esophageal reflux disease without esophagitis: Secondary | ICD-10-CM | POA: Diagnosis not present

## 2021-08-05 DIAGNOSIS — E778 Other disorders of glycoprotein metabolism: Secondary | ICD-10-CM | POA: Diagnosis not present

## 2021-08-06 ENCOUNTER — Ambulatory Visit
Admission: RE | Admit: 2021-08-06 | Discharge: 2021-08-06 | Disposition: A | Discharge status: Home / Self Care | Payer: PPO | Source: Ambulatory Visit | Attending: Obstetrics & Gynecology | Admitting: Obstetrics & Gynecology

## 2021-08-06 DIAGNOSIS — N6452 Nipple discharge: Secondary | ICD-10-CM | POA: Diagnosis not present

## 2021-08-06 DIAGNOSIS — N6459 Other signs and symptoms in breast: Secondary | ICD-10-CM

## 2021-08-06 MED ORDER — GADOBUTROL 1 MMOL/ML IV SOLN
9.0000 mL | Freq: Once | INTRAVENOUS | Status: AC | PRN
  Administered 2021-08-06: 9 mL via INTRAVENOUS

## 2021-10-13 DIAGNOSIS — I10 Essential (primary) hypertension: Secondary | ICD-10-CM | POA: Diagnosis not present

## 2021-10-13 DIAGNOSIS — J449 Chronic obstructive pulmonary disease, unspecified: Secondary | ICD-10-CM | POA: Diagnosis not present

## 2021-10-13 DIAGNOSIS — E039 Hypothyroidism, unspecified: Secondary | ICD-10-CM | POA: Diagnosis not present

## 2021-10-13 DIAGNOSIS — E785 Hyperlipidemia, unspecified: Secondary | ICD-10-CM | POA: Diagnosis not present

## 2021-12-20 DIAGNOSIS — N6452 Nipple discharge: Secondary | ICD-10-CM | POA: Diagnosis not present

## 2022-03-11 DIAGNOSIS — E039 Hypothyroidism, unspecified: Secondary | ICD-10-CM | POA: Diagnosis not present

## 2022-03-11 DIAGNOSIS — R7301 Impaired fasting glucose: Secondary | ICD-10-CM | POA: Diagnosis not present

## 2022-03-11 DIAGNOSIS — R7989 Other specified abnormal findings of blood chemistry: Secondary | ICD-10-CM | POA: Diagnosis not present

## 2022-03-11 DIAGNOSIS — E785 Hyperlipidemia, unspecified: Secondary | ICD-10-CM | POA: Diagnosis not present

## 2022-03-11 DIAGNOSIS — M8589 Other specified disorders of bone density and structure, multiple sites: Secondary | ICD-10-CM | POA: Diagnosis not present

## 2022-03-11 DIAGNOSIS — I10 Essential (primary) hypertension: Secondary | ICD-10-CM | POA: Diagnosis not present

## 2022-03-11 DIAGNOSIS — Z1212 Encounter for screening for malignant neoplasm of rectum: Secondary | ICD-10-CM | POA: Diagnosis not present

## 2022-03-11 DIAGNOSIS — M8580 Other specified disorders of bone density and structure, unspecified site: Secondary | ICD-10-CM | POA: Diagnosis not present

## 2022-03-18 DIAGNOSIS — Q828 Other specified congenital malformations of skin: Secondary | ICD-10-CM | POA: Diagnosis not present

## 2022-03-27 DIAGNOSIS — E039 Hypothyroidism, unspecified: Secondary | ICD-10-CM | POA: Diagnosis not present

## 2022-03-27 DIAGNOSIS — E785 Hyperlipidemia, unspecified: Secondary | ICD-10-CM | POA: Diagnosis not present

## 2022-03-27 DIAGNOSIS — K219 Gastro-esophageal reflux disease without esophagitis: Secondary | ICD-10-CM | POA: Diagnosis not present

## 2022-03-27 DIAGNOSIS — Z8249 Family history of ischemic heart disease and other diseases of the circulatory system: Secondary | ICD-10-CM | POA: Diagnosis not present

## 2022-03-27 DIAGNOSIS — Z0000 Encounter for general adult medical examination without abnormal findings: Secondary | ICD-10-CM | POA: Diagnosis not present

## 2022-03-27 DIAGNOSIS — J449 Chronic obstructive pulmonary disease, unspecified: Secondary | ICD-10-CM | POA: Diagnosis not present

## 2022-03-27 DIAGNOSIS — R7301 Impaired fasting glucose: Secondary | ICD-10-CM | POA: Diagnosis not present

## 2022-03-27 DIAGNOSIS — Z79899 Other long term (current) drug therapy: Secondary | ICD-10-CM | POA: Diagnosis not present

## 2022-03-27 DIAGNOSIS — R82998 Other abnormal findings in urine: Secondary | ICD-10-CM | POA: Diagnosis not present

## 2022-03-27 DIAGNOSIS — I10 Essential (primary) hypertension: Secondary | ICD-10-CM | POA: Diagnosis not present

## 2022-09-15 DIAGNOSIS — R0981 Nasal congestion: Secondary | ICD-10-CM | POA: Diagnosis not present

## 2022-09-15 DIAGNOSIS — I10 Essential (primary) hypertension: Secondary | ICD-10-CM | POA: Diagnosis not present

## 2022-09-15 DIAGNOSIS — R5383 Other fatigue: Secondary | ICD-10-CM | POA: Diagnosis not present

## 2022-09-15 DIAGNOSIS — J449 Chronic obstructive pulmonary disease, unspecified: Secondary | ICD-10-CM | POA: Diagnosis not present

## 2022-09-15 DIAGNOSIS — Z1152 Encounter for screening for COVID-19: Secondary | ICD-10-CM | POA: Diagnosis not present

## 2022-09-15 DIAGNOSIS — J069 Acute upper respiratory infection, unspecified: Secondary | ICD-10-CM | POA: Diagnosis not present

## 2022-09-15 DIAGNOSIS — R6883 Chills (without fever): Secondary | ICD-10-CM | POA: Diagnosis not present

## 2022-09-15 DIAGNOSIS — R051 Acute cough: Secondary | ICD-10-CM | POA: Diagnosis not present

## 2022-11-12 DIAGNOSIS — J449 Chronic obstructive pulmonary disease, unspecified: Secondary | ICD-10-CM | POA: Diagnosis not present

## 2022-11-12 DIAGNOSIS — R051 Acute cough: Secondary | ICD-10-CM | POA: Diagnosis not present

## 2022-11-12 DIAGNOSIS — J069 Acute upper respiratory infection, unspecified: Secondary | ICD-10-CM | POA: Diagnosis not present

## 2022-11-12 DIAGNOSIS — I10 Essential (primary) hypertension: Secondary | ICD-10-CM | POA: Diagnosis not present

## 2023-05-18 DIAGNOSIS — E785 Hyperlipidemia, unspecified: Secondary | ICD-10-CM | POA: Diagnosis not present

## 2023-05-18 DIAGNOSIS — E039 Hypothyroidism, unspecified: Secondary | ICD-10-CM | POA: Diagnosis not present

## 2023-05-18 DIAGNOSIS — Z1212 Encounter for screening for malignant neoplasm of rectum: Secondary | ICD-10-CM | POA: Diagnosis not present

## 2023-05-18 DIAGNOSIS — R7301 Impaired fasting glucose: Secondary | ICD-10-CM | POA: Diagnosis not present

## 2023-05-18 DIAGNOSIS — M8580 Other specified disorders of bone density and structure, unspecified site: Secondary | ICD-10-CM | POA: Diagnosis not present

## 2023-05-18 DIAGNOSIS — I10 Essential (primary) hypertension: Secondary | ICD-10-CM | POA: Diagnosis not present

## 2023-05-24 DIAGNOSIS — Z1212 Encounter for screening for malignant neoplasm of rectum: Secondary | ICD-10-CM | POA: Diagnosis not present

## 2023-05-25 DIAGNOSIS — F17201 Nicotine dependence, unspecified, in remission: Secondary | ICD-10-CM | POA: Diagnosis not present

## 2023-05-25 DIAGNOSIS — Z8249 Family history of ischemic heart disease and other diseases of the circulatory system: Secondary | ICD-10-CM | POA: Diagnosis not present

## 2023-05-25 DIAGNOSIS — I10 Essential (primary) hypertension: Secondary | ICD-10-CM | POA: Diagnosis not present

## 2023-05-25 DIAGNOSIS — E039 Hypothyroidism, unspecified: Secondary | ICD-10-CM | POA: Diagnosis not present

## 2023-05-25 DIAGNOSIS — Z1339 Encounter for screening examination for other mental health and behavioral disorders: Secondary | ICD-10-CM | POA: Diagnosis not present

## 2023-05-25 DIAGNOSIS — Z1331 Encounter for screening for depression: Secondary | ICD-10-CM | POA: Diagnosis not present

## 2023-05-25 DIAGNOSIS — Z23 Encounter for immunization: Secondary | ICD-10-CM | POA: Diagnosis not present

## 2023-05-25 DIAGNOSIS — J329 Chronic sinusitis, unspecified: Secondary | ICD-10-CM | POA: Diagnosis not present

## 2023-05-25 DIAGNOSIS — M25561 Pain in right knee: Secondary | ICD-10-CM | POA: Diagnosis not present

## 2023-05-25 DIAGNOSIS — Z0000 Encounter for general adult medical examination without abnormal findings: Secondary | ICD-10-CM | POA: Diagnosis not present

## 2023-05-25 DIAGNOSIS — R413 Other amnesia: Secondary | ICD-10-CM | POA: Diagnosis not present

## 2023-05-25 DIAGNOSIS — E785 Hyperlipidemia, unspecified: Secondary | ICD-10-CM | POA: Diagnosis not present

## 2023-05-25 DIAGNOSIS — M791 Myalgia, unspecified site: Secondary | ICD-10-CM | POA: Diagnosis not present

## 2023-05-25 DIAGNOSIS — H9190 Unspecified hearing loss, unspecified ear: Secondary | ICD-10-CM | POA: Diagnosis not present

## 2023-05-25 DIAGNOSIS — J449 Chronic obstructive pulmonary disease, unspecified: Secondary | ICD-10-CM | POA: Diagnosis not present

## 2023-05-25 DIAGNOSIS — R82998 Other abnormal findings in urine: Secondary | ICD-10-CM | POA: Diagnosis not present

## 2023-07-28 DIAGNOSIS — R3121 Asymptomatic microscopic hematuria: Secondary | ICD-10-CM | POA: Diagnosis not present

## 2023-07-28 DIAGNOSIS — R350 Frequency of micturition: Secondary | ICD-10-CM | POA: Diagnosis not present

## 2023-07-30 DIAGNOSIS — N3000 Acute cystitis without hematuria: Secondary | ICD-10-CM | POA: Diagnosis not present

## 2023-08-21 DIAGNOSIS — D271 Benign neoplasm of left ovary: Secondary | ICD-10-CM | POA: Diagnosis not present

## 2023-08-21 DIAGNOSIS — K449 Diaphragmatic hernia without obstruction or gangrene: Secondary | ICD-10-CM | POA: Diagnosis not present

## 2023-08-21 DIAGNOSIS — R3129 Other microscopic hematuria: Secondary | ICD-10-CM | POA: Diagnosis not present

## 2023-08-21 DIAGNOSIS — R3121 Asymptomatic microscopic hematuria: Secondary | ICD-10-CM | POA: Diagnosis not present

## 2023-09-09 DIAGNOSIS — R3121 Asymptomatic microscopic hematuria: Secondary | ICD-10-CM | POA: Diagnosis not present

## 2023-09-09 DIAGNOSIS — R350 Frequency of micturition: Secondary | ICD-10-CM | POA: Diagnosis not present

## 2023-09-30 IMAGING — MR MR KNEE*R* W/O CM
4 of 6 series · 19 of 40 positions shown · non-contrast
Comparison: None.

CLINICAL DATA: Right knee pain and swelling for 2 months

EXAM:
MRI OF THE RIGHT KNEE WITHOUT CONTRAST
TECHNIQUE: Multiplanar, multisequence MR imaging of the knee was performed. No
intravenous contrast was administered.

[Series 3: T2 fat-sat · axial · 4.0mm · 0.29mm/px · z∈[-53,+57]mm · 3 of 35 slices shown (1 of 2)]
[im 5/35]
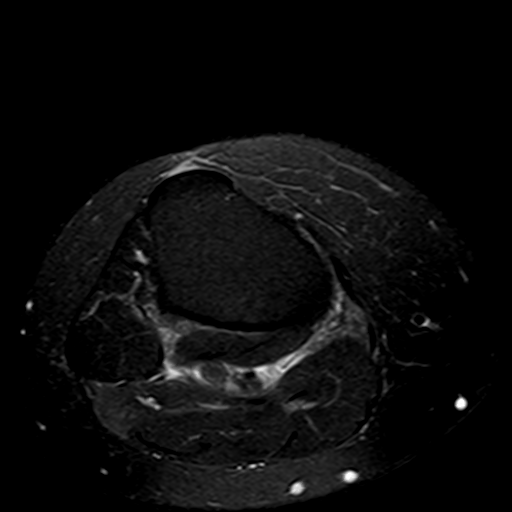
[im 18/35]
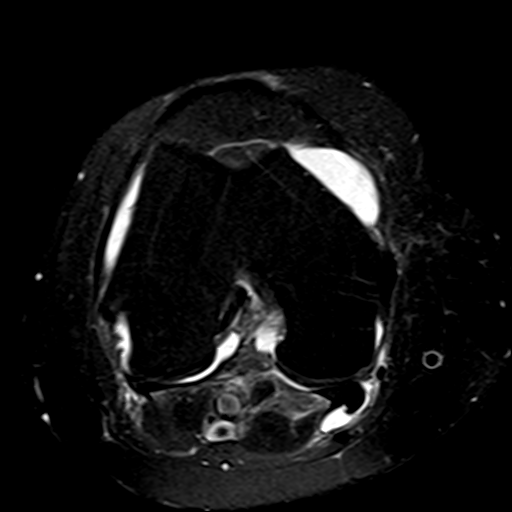
[im 30/35]
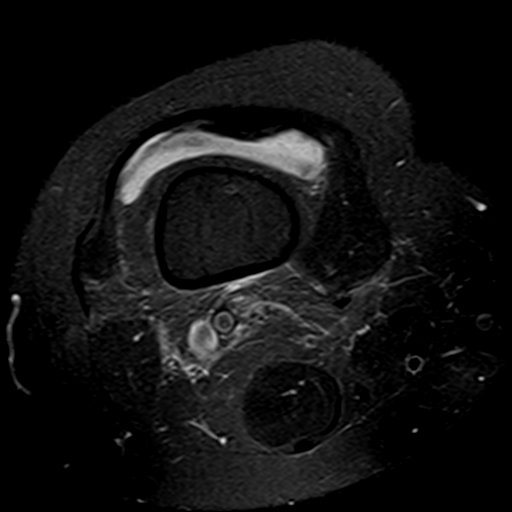

[Series 5: T2 fat-sat · coronal · 4.0mm · 0.31mm/px · 3 of 30 slices shown (2 of 2)]
[im 6/30]
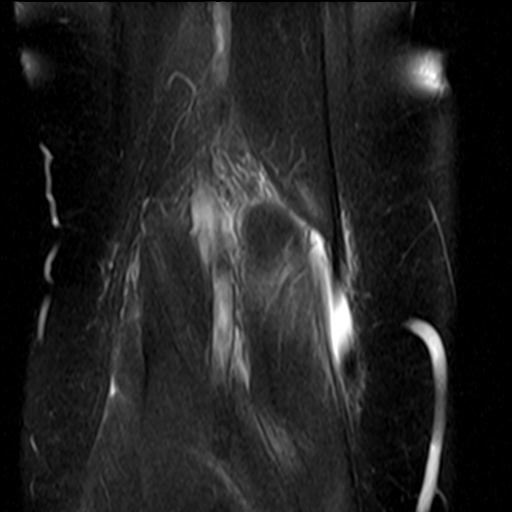
[im 18/30]
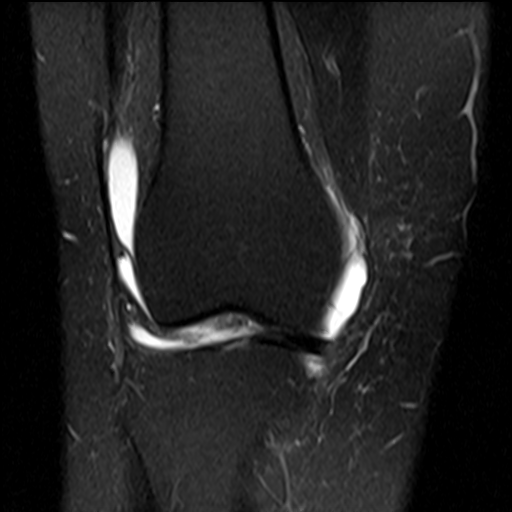
[im 30/30]
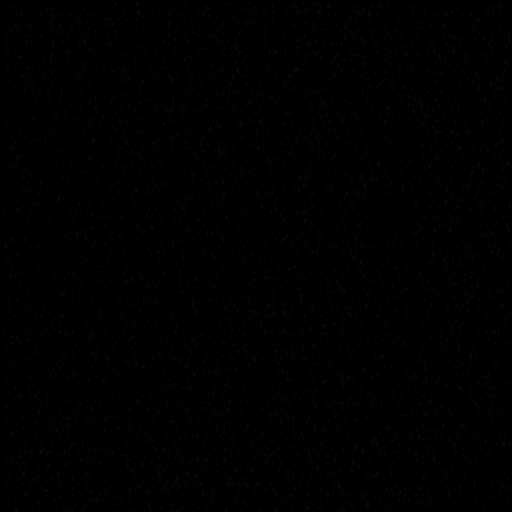

[Series 6: PD fat-sat · coronal · 4.0mm · 0.31mm/px · 6 of 30 slices shown (1 of 2)]
[im 1/30]
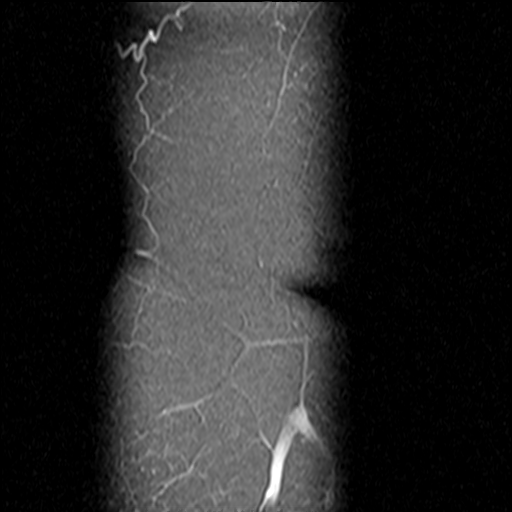
[im 6/30]
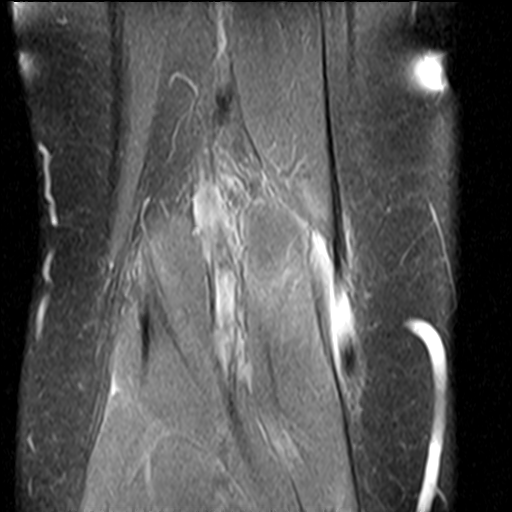
[im 12/30]
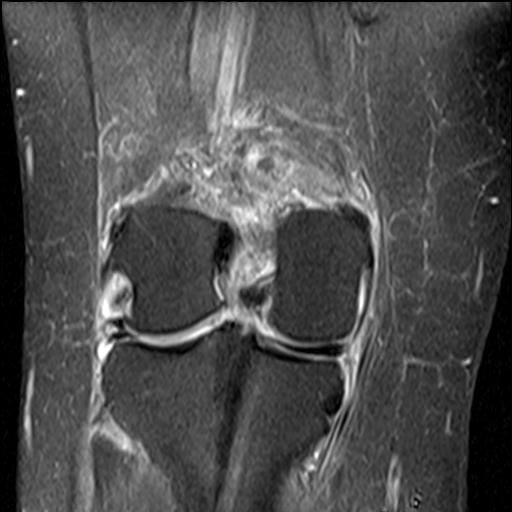
[im 18/30]
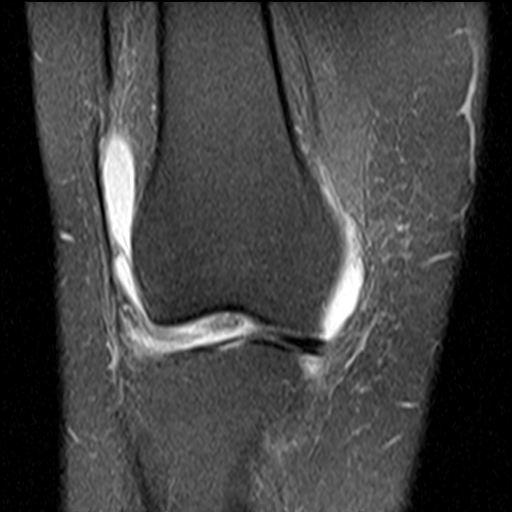
[im 24/30]
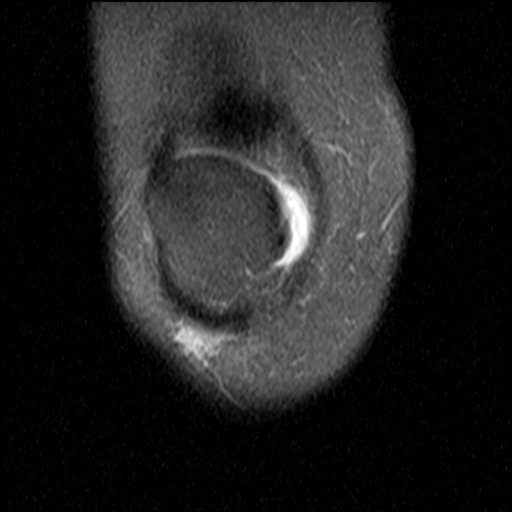
[im 30/30]
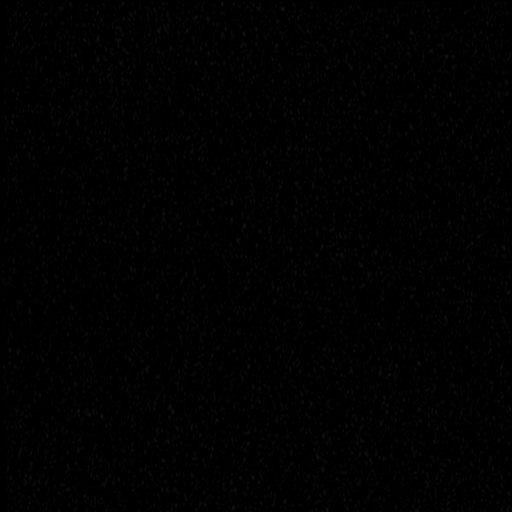

[Series 8: PD fat-sat · sagittal · 3.0mm · 0.31mm/px · 7 of 31 slices shown (2 of 2)]
[im 1/31]
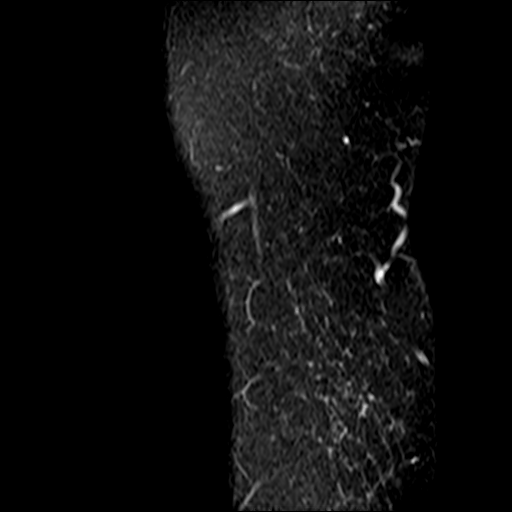
[im 6/31]
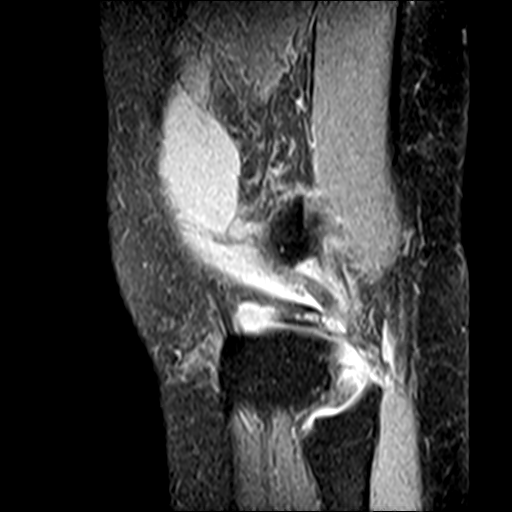
[im 11/31]
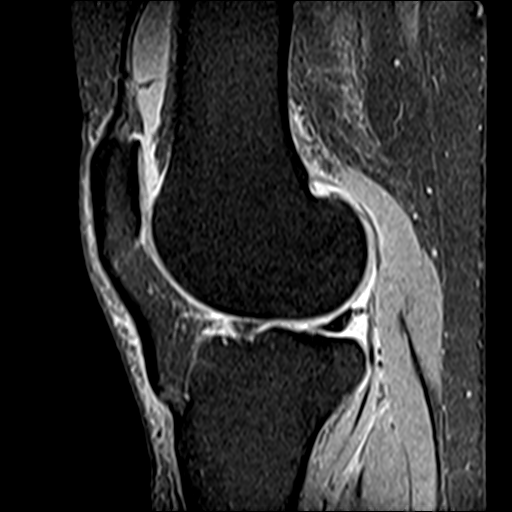
[im 16/31]
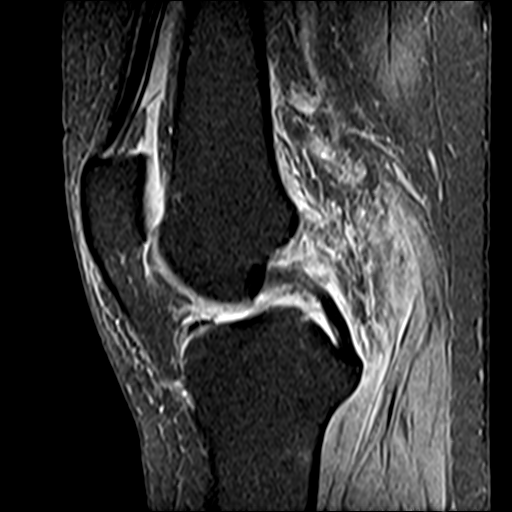
[im 21/31]
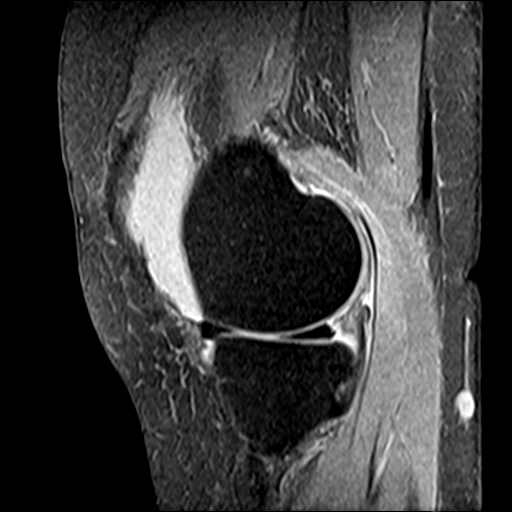
[im 26/31]
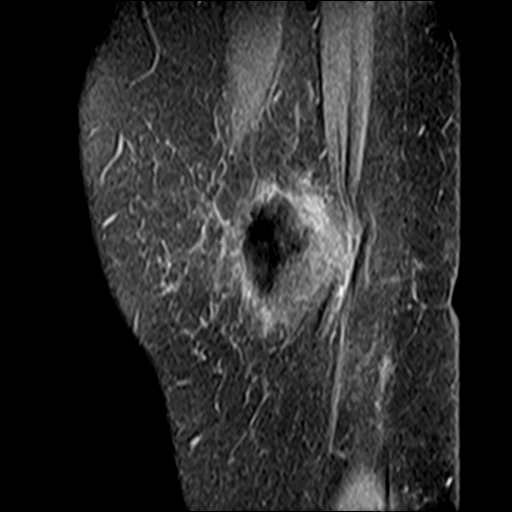
[im 31/31]
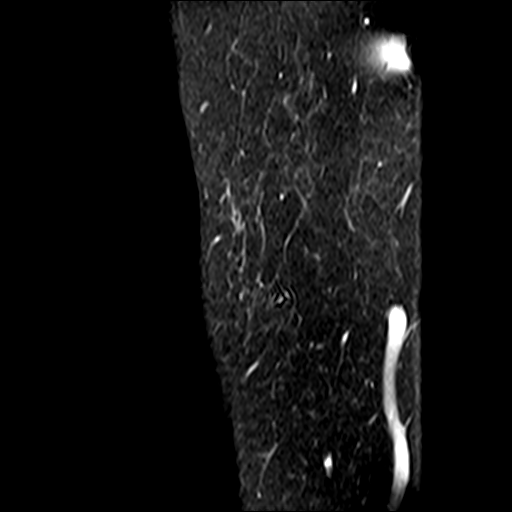

[19 of 40 positions shown; findings below may reference images not displayed]

FINDINGS: MENISCI

Medial meniscus: Intermediate signal of the body/posterior horn of
the medial meniscus without discrete tear.

Lateral meniscus: Chronic degenerative changes with possible
horizontal degenerative tear of the posterior horn of the lateral
meniscus.

LIGAMENTS

Cruciates:  Intact ACL and PCL.

Collaterals: Medial collateral ligament is intact. Lateral
collateral ligament complex is intact.

CARTILAGE

Patellofemoral: Generalized articular cartilage thinning with deep
fissuring. No full-thickness articular cartilage defect.

Medial: Articular cartilage thinning and fissuring at the
weight-bearing area without full-thickness cartilage defect.

Lateral: Articular cartilage thinning without full-thickness
cartilage defect.

Joint: Small suprapatellar joint effusion. Hoffa's fat pad is
unremarkable.

Popliteal Fossa: Baker cyst measuring 2.1 x 1.0 x 2.9 cm. Popliteus
tendon origin is intact.

Extensor Mechanism:  Intact quadriceps tendon and patellar tendon.

Bones: No focal marrow signal abnormality. No fracture or
dislocation.

Other: None.
IMPRESSION: 1. Degenerative changes of the medial meniscus without discrete
tear.

2. Degenerative changes of the lateral meniscus with possible
horizontal tear in the posterior horn of the lateral meniscus.

3. Tricompartmental articular cartilage thinning without evidence of
full thickness cartilage defect. Small joint effusion, likely
reactive.

4.  Popliteal fossa cyst measuring 2.1 x 1.0 x 2.9 cm.

5.  No evidence of ligamentous injury.

## 2023-11-03 DIAGNOSIS — W19XXXA Unspecified fall, initial encounter: Secondary | ICD-10-CM | POA: Diagnosis not present

## 2023-11-03 DIAGNOSIS — M25511 Pain in right shoulder: Secondary | ICD-10-CM | POA: Diagnosis not present

## 2023-11-03 DIAGNOSIS — I10 Essential (primary) hypertension: Secondary | ICD-10-CM | POA: Diagnosis not present

## 2023-11-18 NOTE — Therapy (Incomplete)
 OUTPATIENT PHYSICAL THERAPY SHOULDER EVALUATION   Patient Name: Jennifer Bradley MRN: [id number] DOB:Feb 23, 1956, 68 y.o., female Today's Date: 11/18/2023   END OF SESSION:   Past Medical History:  Diagnosis Date   Allergy    Cataract    COPD (chronic obstructive pulmonary disease) (HCC)    GERD (gastroesophageal reflux disease)    Hyperlipidemia    Hypertension    PONV (postoperative nausea and vomiting)    Thyroid  disease    Past Surgical History:  Procedure Laterality Date   APPENDECTOMY     ARTHRODESIS METATARSALPHALANGEAL JOINT (MTPJ) Left 07/07/2019   Procedure: Left hallux metatarsal phalangeal joint arthrodesis;  Surgeon: Amada Backer, MD;  Location: [facility];  Service: Orthopedics;  Laterality: Left;   BLADDER SUSPENSION     broken wrist Left    BUNIONECTOMY Left    CHOLECYSTECTOMY     HARDWARE REMOVAL Left 07/07/2019   Procedure: Hardware removal left foot;  Surgeon: Amada Backer, MD;  Location: [facility];  Service: Orthopedics;  Laterality: Left;   THYROIDECTOMY     Patient Active Problem List   Diagnosis Date Noted   Angina pectoris (HCC) 01/24/2020   Family history of early CAD 01/24/2020   COPD, mild (HCC) 10/10/2019   Shortness of breath 07/25/2019   Hypertension    Hyperlipidemia    GERD (gastroesophageal reflux disease)     PCP: Aldo Hun, MD   REFERRING PROVIDER: Aldo Hun, MD   REFERRING DIAG: M25.511 - Acute pain of right shoulder   THERAPY DIAG:  No diagnosis found.  RATIONALE FOR EVALUATION AND TREATMENT: {HABREHAB:27488}  ONSET DATE: ***  NEXT MD VISIT: ***   SUBJECTIVE:                                                                                                                                                                                                         SUBJECTIVE STATEMENT: ***  PAIN: Are you having pain? {OPRCPAIN:27236}  PERTINENT HISTORY:  ***HTN, HLD, GERD, angina, SOB, L wrist  fracture, L foot surgery  PRECAUTIONS: {Therapy precautions:24002}  RED FLAGS: {PT Red Flags:29287}  HAND DOMINANCE: {Hand Dominance:29389}  WEIGHT BEARING RESTRICTIONS: {Yes ***/No:24003}  FALLS:  Has patient fallen in last 6 months? {fallsyesno:27318}  LIVING ENVIRONMENT: Lives with: {OPRC lives with:25569::"lives with their family"} Lives in: {Lives in:25570} Stairs: {opstairs:27293} Has following equipment at home: {Assistive devices:23999}  OCCUPATION: ***  PLOF: {PLOF:24004}  PATIENT GOALS: ***   OBJECTIVE: (objective measures completed at initial evaluation unless otherwise dated)  DIAGNOSTIC FINDINGS:  ***11/03/23 - XR R shoulder:  Negative for  PATIENT SURVEYS:  {rehab surveys:24030:a}  COGNITION: Overall cognitive status: {cognition:24006}     SENSATION: {sensation:27233}  POSTURE: {posture:25561}  UPPER EXTREMITY ROM:   {AROM/PROM:27142} ROM Right eval Left eval  Shoulder flexion    Shoulder extension    Shoulder abduction    Shoulder adduction    Shoulder internal rotation    Shoulder external rotation    Elbow flexion    Elbow extension    Wrist flexion    Wrist extension    Wrist ulnar deviation    Wrist radial deviation    Wrist pronation    Wrist supination    (Blank rows = not tested)  UPPER EXTREMITY MMT:  MMT Right eval Left eval  Shoulder flexion    Shoulder extension    Shoulder abduction    Shoulder adduction    Shoulder internal rotation    Shoulder external rotation    Middle trapezius    Lower trapezius    Elbow flexion    Elbow extension    Wrist flexion    Wrist extension    Wrist ulnar deviation    Wrist radial deviation    Wrist pronation    Wrist supination    Grip strength (lbs)    (Blank rows = not tested)  SHOULDER SPECIAL TESTS: Impingement tests: {shoulder impingement test:25231:a} SLAP lesions: {SLAP lesions:25232} Instability tests: {shoulder instability test:25233} Rotator cuff assessment:  {rotator cuff assessment:25234} Biceps assessment: {biceps assessment:25235}  JOINT MOBILITY TESTING:  ***  PALPATION:  ***    TODAY'S TREATMENT:   ***   PATIENT EDUCATION:  Education details: {Education details:27468}  Person educated: {Person educated:25204} Education method: {Education Method:25205} Education comprehension: {Education Comprehension:25206}  HOME EXERCISE PROGRAM: ***   ASSESSMENT:  CLINICAL IMPRESSION: Jennifer Bradley is a 68 y.o. female who was referred to physical therapy for evaluation and treatment for acute R shoulder pain and multiple. ***   Patient reports onset of *** pain beginning ***. Pain is worse with ***.  Patient has deficits in *** ROM, *** flexibility, *** strength, ***abnormal posture, and TTP with abnormal muscle tension *** which are interfering with ADLs and are impacting quality of life.  On QuickDASH patient scored ***/100 demonstrating ***% disability.  Larissa will benefit from skilled PT to address above deficits to improve mobility and activity tolerance with decreased pain interference.   OBJECTIVE IMPAIRMENTS: {opptimpairments:25111}.   ACTIVITY LIMITATIONS: {activitylimitations:27494}  PARTICIPATION LIMITATIONS: {participationrestrictions:25113}  PERSONAL FACTORS: {Personal factors:25162} are also affecting patient's functional outcome.   REHAB POTENTIAL: {rehabpotential:25112}  CLINICAL DECISION MAKING: {clinical decision making:25114}  EVALUATION COMPLEXITY: {Evaluation complexity:25115}   GOALS: Goals reviewed with patient? {yes/no:20286}  SHORT TERM GOALS: Target date: ***  Patient will be independent with initial HEP to improve outcomes and carryover.  Baseline: *** Goal status: {GOALSTATUS:25110}  2.  Patient will report 25% improvement in *** shoulder pain to improve QOL.   Baseline: *** Goal status: {GOALSTATUS:25110}  3.  *** Baseline: *** Goal status: {GOALSTATUS:25110}  LONG TERM GOALS: Target date:  ***  Patient will be independent with ongoing/advanced HEP for self-management at home.  Baseline: *** Goal status: {GOALSTATUS:25110}  2.  Patient will report 50-75% improvement in *** shoulder pain to improve QOL.  Baseline: *** Goal status: {GOALSTATUS:25110}  3.  Patient to demonstrate improved upright posture with posterior shoulder girdle engaged to promote improved glenohumeral joint mobility. Baseline: *** Goal status: {GOALSTATUS:25110}  4.  Patient to improve *** shoulder AROM to {Functional status:27472} without pain provocation to allow for increased  ease of ADLs.  Baseline: Refer to above UE ROM table Goal status: {GOALSTATUS:25110}  5.  Patient will demonstrate improved *** strength to >/= ***/5 for functional UE use. Baseline: Refer to above UE MMT table Goal status: {GOALSTATUS:25110}  6  Patient will report </= ***% on QuickDASH (MCID = 14%) to demonstrate improved functional ability.  Baseline: *** Goal status: {GOALSTATUS:25110}  7.  Patient will ***   Baseline: *** Goal status: {GOALSTATUS:25110}   8. *** Baseline: *** Goal status: {GOALSTATUS:25110}   PLAN:  PT FREQUENCY: {rehab frequency:25116}  PT DURATION: {rehab duration:25117}  PLANNED INTERVENTIONS: {rehab planned interventions:25118::"97110-Therapeutic exercises","97530- Therapeutic [phone]- Neuromuscular re-education","97535- Self FAOZ","30865- Manual therapy"}  PLAN FOR NEXT SESSION: ***   Francisco Irving, PT 11/18/2023, 2:01 PM

## 2023-11-25 ENCOUNTER — Other Ambulatory Visit (INDEPENDENT_AMBULATORY_CARE_PROVIDER_SITE_OTHER)

## 2023-11-25 ENCOUNTER — Encounter: Payer: Self-pay | Admitting: Orthopaedic Surgery

## 2023-11-25 ENCOUNTER — Ambulatory Visit: Admitting: Physical Therapy

## 2023-11-25 ENCOUNTER — Ambulatory Visit: Admitting: Orthopaedic Surgery

## 2023-11-25 DIAGNOSIS — G8929 Other chronic pain: Secondary | ICD-10-CM | POA: Diagnosis not present

## 2023-11-25 DIAGNOSIS — M25511 Pain in right shoulder: Secondary | ICD-10-CM

## 2023-11-25 MED ORDER — LIDOCAINE HCL 1 % IJ SOLN
3.0000 mL | INTRAMUSCULAR | Status: AC | PRN
  Administered 2023-11-25: 3 mL

## 2023-11-25 MED ORDER — BUPIVACAINE HCL 0.5 % IJ SOLN
3.0000 mL | INTRAMUSCULAR | Status: AC | PRN
  Administered 2023-11-25: 3 mL via INTRA_ARTICULAR

## 2023-11-25 MED ORDER — METHYLPREDNISOLONE ACETATE 40 MG/ML IJ SUSP
40.0000 mg | INTRAMUSCULAR | Status: AC | PRN
  Administered 2023-11-25: 40 mg via INTRA_ARTICULAR

## 2023-11-25 NOTE — Progress Notes (Signed)
 Office Visit Note   Patient: Jennifer Bradley           Date of Birth: October 11, 1955           MRN: [id number] Visit Date: 11/25/2023              Requested by: Aldo Hun, MD [address] [place],  [place] [address] PCP: Aldo Hun, MD   Assessment & Plan: Visit Diagnoses:  1. Chronic right shoulder pain     Plan: History of Present Illness Jennifer Bradley is a 68 year old female who presents with shoulder pain following a fall.  Approximately one month ago, she fell, landing on her shoulder. She experiences significant pain with shoulder movement, especially when reaching across her body to the left. Pain is located in the upper arm and radiates to the elbow and hand. Neck movement to the left exacerbates the pain, which is felt in the back.  No bruising was observed post-fall. She is right-handed, and using her right hand to touch her left shoulder is painful and feels stiff.  Physical Exam MUSCULOSKELETAL: Forward flexion to 135 degrees with pain, abduction to 95 degrees with pain, external rotation to 60 degrees without pain. Pain with impingement. Good strength in rotator cuff testing. Tenderness in the [facility] joint.  Results RADIOLOGY Shoulder X-ray: No obvious abnormalities in bony structure, minimal acromioclavicular [facility]) joint arthritis, normal glenohumeral joint (11/25/2023)  Assessment and Plan Rotator cuff strain or partial tear Suspected partial tear with strong rotator cuff strength and no significant x-ray abnormalities. Differential includes traumatic bursitis. - Administer subacromial cortisone injection. - Provide home exercise program for rehabilitation once pain improves. - Reassess in six weeks; if no improvement, order MRI.  Follow-Up Instructions: No follow-ups on file.   Orders:  Orders Placed This Encounter  Procedures   XR Shoulder Right   No orders of the defined types were placed in this encounter.     Procedures: Large Joint Inj: R subacromial bursa on  11/25/2023 9:23 AM Indications: pain Details: 22 G needle  Arthrogram: No  Medications: 3 mL lidocaine  1 %; 3 mL bupivacaine  0.5 %; 40 mg methylPREDNISolone  acetate 40 MG/ML Outcome: tolerated well, no immediate complications Consent was given by the patient. Patient was prepped and draped in the usual sterile fashion.       Clinical Data: No additional findings.   Subjective: Chief Complaint  Patient presents with   Right Shoulder - Pain    HPI  Review of Systems  Constitutional: Negative.   HENT: Negative.    Eyes: Negative.   Respiratory: Negative.    Cardiovascular: Negative.   Endocrine: Negative.   Musculoskeletal: Negative.   Neurological: Negative.   Hematological: Negative.   Psychiatric/Behavioral: Negative.    All other systems reviewed and are negative.    Objective: Vital Signs: There were no vitals taken for this visit.  Physical Exam Vitals and nursing note reviewed.  Constitutional:      Appearance: She is well-developed.  HENT:     Head: Atraumatic.     Nose: Nose normal.  Eyes:     Extraocular Movements: Extraocular movements intact.  Cardiovascular:     Pulses: Normal pulses.  Pulmonary:     Effort: Pulmonary effort is normal.  Abdominal:     Palpations: Abdomen is soft.  Musculoskeletal:     Cervical back: Neck supple.  Skin:    General: Skin is warm.     Capillary Refill: Capillary refill takes less than 2 seconds.  Neurological:     Mental Status: She is alert. Mental status is at baseline.  Psychiatric:        Behavior: Behavior normal.        Thought Content: Thought content normal.        Judgment: Judgment normal.     Ortho Exam  Specialty Comments:  No specialty comments available.  Imaging: XR Shoulder Right Result Date: 11/25/2023 X-rays of the right shoulder showed no acute abnormalities.  Mild arthritis of the [facility] joint.    PMFS History: Patient Active Problem List   Diagnosis Date Noted   Angina  pectoris (HCC) 01/24/2020   Family history of early CAD 01/24/2020   COPD, mild (HCC) 10/10/2019   Shortness of breath 07/25/2019   Hypertension    Hyperlipidemia    GERD (gastroesophageal reflux disease)    Past Medical History:  Diagnosis Date   Allergy    Cataract    COPD (chronic obstructive pulmonary disease) (HCC)    GERD (gastroesophageal reflux disease)    Hyperlipidemia    Hypertension    PONV (postoperative nausea and vomiting)    Thyroid  disease     Family History  Problem Relation Age of Onset   Heart attack Mother    Stroke Mother    Healthy Father    Dementia Brother    Heart attack Brother    Colon cancer Neg Hx    Esophageal cancer Neg Hx    Rectal cancer Neg Hx    Stomach cancer Neg Hx     Past Surgical History:  Procedure Laterality Date   APPENDECTOMY     ARTHRODESIS METATARSALPHALANGEAL JOINT (MTPJ) Left 07/07/2019   Procedure: Left hallux metatarsal phalangeal joint arthrodesis;  Surgeon: Amada Backer, MD;  Location: [facility];  Service: Orthopedics;  Laterality: Left;   BLADDER SUSPENSION     broken wrist Left    BUNIONECTOMY Left    CHOLECYSTECTOMY     HARDWARE REMOVAL Left 07/07/2019   Procedure: Hardware removal left foot;  Surgeon: Amada Backer, MD;  Location: [facility];  Service: Orthopedics;  Laterality: Left;   THYROIDECTOMY     Social History   Occupational History   Not on file  Tobacco Use   Smoking status: Former    Current packs/day: 0.00    Average packs/day: 0.8 packs/day for 20.0 years (15.0 ttl pk-yrs)    Types: Cigarettes    Start date: 26    Quit date: 2019    Years since quitting: 6.4   Smokeless tobacco: Never  Vaping Use   Vaping status: Never Used  Substance and Sexual Activity   Alcohol  use: Yes    Comment: occasional wine   Drug use: No   Sexual activity: Not on file

## 2023-12-15 ENCOUNTER — Other Ambulatory Visit: Payer: Self-pay | Admitting: Internal Medicine

## 2023-12-15 ENCOUNTER — Encounter: Payer: Self-pay | Admitting: Internal Medicine

## 2023-12-15 DIAGNOSIS — H9190 Unspecified hearing loss, unspecified ear: Secondary | ICD-10-CM | POA: Diagnosis not present

## 2023-12-15 DIAGNOSIS — E785 Hyperlipidemia, unspecified: Secondary | ICD-10-CM | POA: Diagnosis not present

## 2023-12-15 DIAGNOSIS — J329 Chronic sinusitis, unspecified: Secondary | ICD-10-CM | POA: Diagnosis not present

## 2023-12-15 DIAGNOSIS — E739 Lactose intolerance, unspecified: Secondary | ICD-10-CM | POA: Diagnosis not present

## 2023-12-15 DIAGNOSIS — E039 Hypothyroidism, unspecified: Secondary | ICD-10-CM | POA: Diagnosis not present

## 2023-12-15 DIAGNOSIS — R7301 Impaired fasting glucose: Secondary | ICD-10-CM | POA: Diagnosis not present

## 2023-12-15 DIAGNOSIS — M25559 Pain in unspecified hip: Secondary | ICD-10-CM | POA: Diagnosis not present

## 2023-12-15 DIAGNOSIS — M8580 Other specified disorders of bone density and structure, unspecified site: Secondary | ICD-10-CM | POA: Diagnosis not present

## 2023-12-15 DIAGNOSIS — F17201 Nicotine dependence, unspecified, in remission: Secondary | ICD-10-CM | POA: Diagnosis not present

## 2023-12-15 DIAGNOSIS — I10 Essential (primary) hypertension: Secondary | ICD-10-CM

## 2023-12-15 DIAGNOSIS — J449 Chronic obstructive pulmonary disease, unspecified: Secondary | ICD-10-CM | POA: Diagnosis not present

## 2023-12-15 DIAGNOSIS — M25511 Pain in right shoulder: Secondary | ICD-10-CM | POA: Diagnosis not present

## 2023-12-24 ENCOUNTER — Ambulatory Visit
Admission: RE | Admit: 2023-12-24 | Discharge: 2023-12-24 | Disposition: A | Discharge status: Home / Self Care | Source: Ambulatory Visit | Attending: Internal Medicine | Admitting: Internal Medicine

## 2023-12-24 DIAGNOSIS — I10 Essential (primary) hypertension: Secondary | ICD-10-CM

## 2024-01-05 DIAGNOSIS — I10 Essential (primary) hypertension: Secondary | ICD-10-CM | POA: Diagnosis not present

## 2024-01-05 DIAGNOSIS — K219 Gastro-esophageal reflux disease without esophagitis: Secondary | ICD-10-CM | POA: Diagnosis not present

## 2024-01-05 DIAGNOSIS — I701 Atherosclerosis of renal artery: Secondary | ICD-10-CM | POA: Diagnosis not present

## 2024-01-14 ENCOUNTER — Encounter: Payer: Self-pay | Admitting: Orthopaedic Surgery

## 2024-01-14 ENCOUNTER — Ambulatory Visit: Admitting: Orthopaedic Surgery

## 2024-01-14 DIAGNOSIS — M25511 Pain in right shoulder: Secondary | ICD-10-CM | POA: Diagnosis not present

## 2024-01-14 DIAGNOSIS — G8929 Other chronic pain: Secondary | ICD-10-CM

## 2024-01-14 NOTE — Progress Notes (Signed)
 Patient: Jennifer Bradley           Date of Birth: August 06, 1955           MRN: [id number] Visit Date: 01/14/2024 PCP: Shayne Anes, MD   Assessment & Plan:  Chief Complaint:  Chief Complaint  Patient presents with   Right Shoulder - Pain   Visit Diagnoses:  1. Chronic right shoulder pain     Plan: History of Present Illness Jennifer Bradley is a 68 year old female who presents with persistent shoulder pain and decreased mobility following a fall.  She experiences severe shoulder pain that worsens with movements such as reaching behind her back or placing dishes in the dishwasher. Initially, the pain improved for two weeks following a subacromial injection but has since worsened. Her shoulder appears lower than the other, and she is unable to reach behind her back. The pain and stiffness significantly impact her daily activities. She takes Tylenol  and ronidazole for her symptoms. She mentions having arthritis, which may contribute to her symptoms.  Physical Exam MUSCULOSKELETAL: Right shoulder flexion limited to 90 degrees with pain.  ER to 35 degrees with pain.  IR to back pocket with pain.    Assessment and Plan Right shoulder pain with decreased range of motion due to suspected rotator cuff tear and secondary adhesive capsulitis Significant pain and limited range of motion likely from partial rotator cuff tear and adhesive capsulitis. Initial bursa injection provided temporary relief. MRI needed for confirmation and to exclude other issues. - Order MRI of right shoulder. - If MRI confirms diagnosis, refer for intraarticular cortisone injection. - Refer to physical therapy for rehabilitation. - Advise anti-inflammatory medications like Advil or Aleve.  Follow-Up Instructions: No follow-ups on file.   Orders:  No orders of the defined types were placed in this encounter.  No orders of the defined types were placed in this encounter.   Imaging: No results found.  PMFS History: Patient  Active Problem List   Diagnosis Date Noted   Angina pectoris (HCC) 01/24/2020   Family history of early CAD 01/24/2020   COPD, mild (HCC) 10/10/2019   Shortness of breath 07/25/2019   Hypertension    Hyperlipidemia    GERD (gastroesophageal reflux disease)    Past Medical History:  Diagnosis Date   Allergy    Cataract    COPD (chronic obstructive pulmonary disease) (HCC)    GERD (gastroesophageal reflux disease)    Hyperlipidemia    Hypertension    PONV (postoperative nausea and vomiting)    Thyroid  disease     Family History  Problem Relation Age of Onset   Heart attack Mother    Stroke Mother    Healthy Father    Dementia Brother    Heart attack Brother    Colon cancer Neg Hx    Esophageal cancer Neg Hx    Rectal cancer Neg Hx    Stomach cancer Neg Hx     Past Surgical History:  Procedure Laterality Date   APPENDECTOMY     ARTHRODESIS METATARSALPHALANGEAL JOINT (MTPJ) Left 07/07/2019   Procedure: Left hallux metatarsal phalangeal joint arthrodesis;  Surgeon: Kit Rush, MD;  Location: [facility];  Service: Orthopedics;  Laterality: Left;   BLADDER SUSPENSION     broken wrist Left    BUNIONECTOMY Left    CHOLECYSTECTOMY     HARDWARE REMOVAL Left 07/07/2019   Procedure: Hardware removal left foot;  Surgeon: Kit Rush, MD;  Location: [facility];  Service:  Orthopedics;  Laterality: Left;   THYROIDECTOMY     Social History   Occupational History   Not on file  Tobacco Use   Smoking status: Former    Current packs/day: 0.00    Average packs/day: 0.8 packs/day for 20.0 years (15.0 ttl pk-yrs)    Types: Cigarettes    Start date: 84    Quit date: 2019    Years since quitting: 6.5   Smokeless tobacco: Never  Vaping Use   Vaping status: Never Used  Substance and Sexual Activity   Alcohol  use: Yes    Comment: occasional wine   Drug use: No   Sexual activity: Not on file

## 2024-01-14 NOTE — Addendum Note (Signed)
 Addended by: Andilyn Bettcher on: 01/14/2024 11:21 AM   Modules accepted: Orders

## 2024-01-17 ENCOUNTER — Inpatient Hospital Stay
Admission: RE | Admit: 2024-01-17 | Discharge: 2024-01-17 | Disposition: A | Discharge status: Home / Self Care | Source: Ambulatory Visit | Attending: Orthopaedic Surgery | Admitting: Orthopaedic Surgery

## 2024-01-17 DIAGNOSIS — G8929 Other chronic pain: Secondary | ICD-10-CM

## 2024-01-17 DIAGNOSIS — M75111 Incomplete rotator cuff tear or rupture of right shoulder, not specified as traumatic: Secondary | ICD-10-CM | POA: Diagnosis not present

## 2024-01-18 ENCOUNTER — Ambulatory Visit: Payer: Self-pay | Admitting: Orthopaedic Surgery

## 2024-01-18 NOTE — Progress Notes (Signed)
 Called patient tonight to explain MRI.  Please place order for PT for frozen shoulder and intra articular steroid injection with [place].  Thank you.

## 2024-01-19 ENCOUNTER — Other Ambulatory Visit: Payer: Self-pay

## 2024-01-19 DIAGNOSIS — G8929 Other chronic pain: Secondary | ICD-10-CM

## 2024-02-01 ENCOUNTER — Encounter: Payer: Self-pay | Admitting: Sports Medicine

## 2024-02-01 ENCOUNTER — Other Ambulatory Visit: Payer: Self-pay

## 2024-02-01 ENCOUNTER — Ambulatory Visit (INDEPENDENT_AMBULATORY_CARE_PROVIDER_SITE_OTHER): Admitting: Sports Medicine

## 2024-02-01 DIAGNOSIS — G8929 Other chronic pain: Secondary | ICD-10-CM

## 2024-02-01 DIAGNOSIS — M25511 Pain in right shoulder: Secondary | ICD-10-CM

## 2024-02-01 DIAGNOSIS — M7501 Adhesive capsulitis of right shoulder: Secondary | ICD-10-CM | POA: Diagnosis not present

## 2024-02-01 MED ORDER — LIDOCAINE HCL 1 % IJ SOLN
2.0000 mL | INTRAMUSCULAR | Status: AC | PRN
  Administered 2024-02-01: 2 mL

## 2024-02-01 MED ORDER — METHYLPREDNISOLONE ACETATE 40 MG/ML IJ SUSP
80.0000 mg | INTRAMUSCULAR | Status: AC | PRN
  Administered 2024-02-01: 80 mg via INTRA_ARTICULAR

## 2024-02-01 MED ORDER — BUPIVACAINE HCL 0.25 % IJ SOLN
2.0000 mL | INTRAMUSCULAR | Status: AC | PRN
  Administered 2024-02-01: 2 mL via INTRA_ARTICULAR

## 2024-02-01 NOTE — Progress Notes (Signed)
 Jennifer Bradley - 68 y.o. female MRN [id number]  Date of birth: 06/21/1955  Office Visit Note: Visit Date: 02/01/2024 PCP: Shayne Anes, MD Referred by: Jerri Kay HERO, MD  Subjective: Chief Complaint  Patient presents with   Right Shoulder - Pain   HPI: Jennifer Bradley is a pleasant 69 y.o. female who presents today for chronic right shoulder pain with adhesive capsulitis.  Evamae has been dealing with chronic right shoulder pain.  Back in May she had a fall, landing on that shoulder.  Since that time she has pain with motion and has more recent and progressive restriction in range of motion.  Initially had a subacromial joint injection with mild relief but her pain and limitation in motion continues to worsen.  She has been referred to physical therapy and does start this this upcoming Wednesday.  She is using Advil or Tylenol  only as needed for significant pain but not taking anything on a consistent basis.  Pertinent ROS were reviewed with the patient and found to be negative unless otherwise specified above in HPI.   Assessment & Plan: Visit Diagnoses:  1. Adhesive capsulitis of right shoulder   2. Chronic right shoulder pain    Plan: Impression is chronic and progressive right shoulder pain with limitation in range of motion with symptomatology as well as previous MRI consistent with adhesive capsulitis.  We discussed the pathophysiology of this and associated treatment including injection therapy as well as formalized physical therapy to work on stretching out the capsule and improving range of motion.  Through shared decision making, we did proceed with ultrasound-guided glenohumeral joint injection, patient tolerated well.  She will have modified activity for the next 48 hours and then may begin her PT starting on Wednesday.  Okay for Tylenol /Advil or heat for any postinjection pain.  I would like to see her back in about 2 weeks to reevaluate her motion and her pain and we may consider a higher  volume glenohumeral injection at that time only if needed.  Follow-up: Return in about 2 weeks (around 02/15/2024) for f/u frozen shoulder  Meds & Orders: No orders of the defined types were placed in this encounter.   Orders Placed This Encounter  Procedures   Large Joint Inj   US  Guided Needle Placement - No Linked Charges     Procedures: Large Joint Inj: R glenohumeral on 02/01/2024 8:22 AM Indications: pain Details: 22 G 3.5 in needle, ultrasound-guided posterior approach Medications: 2 mL lidocaine  1 %; 2 mL bupivacaine  0.25 %; 80 mg methylPREDNISolone  acetate 40 MG/ML Outcome: tolerated well, no immediate complications  US -guided glenohumeral joint injection, right shoulder After discussion on risks/benefits/indications, informed verbal consent was obtained. A timeout was then performed. The patient was positioned lying lateral recumbent on examination table. The patient's shoulder was prepped with betadine and multiple alcohol  swabs and utilizing ultrasound guidance, the patient's glenohumeral joint was identified on ultrasound. Using ultrasound guidance a 22-gauge, 3.5 inch needle with a mixture of 2:2:2 cc's lidocaine :bupivicaine:depomedrol was directed from a lateral to medial direction via in-plane technique into the glenohumeral joint with visualization of appropriate spread of injectate into the joint. Patient tolerated the procedure well without immediate complications.      Procedure, treatment alternatives, risks and benefits explained, specific risks discussed. Consent was given by the patient. Immediately prior to procedure a time out was called to verify the correct patient, procedure, equipment, support staff and site/side marked as required. Patient was prepped and draped in the usual sterile  fashion.          Clinical History: No specialty comments available.  She reports that she quit smoking about 6 years ago. Her smoking use included cigarettes. She started  smoking about 26 years ago. She has a 15 pack-year smoking history. She has never used smokeless tobacco. No results for input(s): HGBA1C, LABURIC in the last 8760 hours.  Objective:    Physical Exam  Gen: Well-appearing, in no acute distress; non-toxic CV: Well-perfused. Warm.  Resp: Breathing unlabored on room air; no wheezing. Psych: Fluid speech in conversation; appropriate affect; normal thought process  Ortho Exam - Right shoulder: There is no redness swelling or effusion.  There is gross restriction in both passive and active range of motion, as follows:  -Flexion: 100 degrees - Abduction: 95 degrees - External rotation: 40 degrees - Internal rotation: Thumb to SI joint (compared to T10 cont arm)  Imaging:  *I did review her shoulder MRI during the visit today.  MR SHOULDER RIGHT WO CONTRAST CLINICAL DATA:  Right shoulder pain for 3 months.  EXAM: MRI OF THE RIGHT SHOULDER WITHOUT CONTRAST  TECHNIQUE: Multiplanar, multisequence MR imaging of the shoulder was performed. No intravenous contrast was administered.  COMPARISON:  None Available.  FINDINGS: Rotator cuff: Moderate supraspinatus tendinosis with a high-grade partial-thickness articular surface tear with a small full-thickness component. Mild infraspinatus tendinosis. Teres minor tendon is intact. Subscapularis tendon is intact.  Muscles: No muscle atrophy or edema. No intramuscular fluid collection or hematoma.  Biceps Long Head: Mild tendinosis of the intra-articular portion of the long head of the biceps tendon.  Acromioclavicular Joint: Mild arthropathy of the acromioclavicular joint. Trace subacromial/subdeltoid bursal fluid.  Glenohumeral Joint: Small joint effusion. No chondral defect. Thickening of the inferior joint capsule as can be seen with adhesive capsulitis.  Labrum: Grossly intact, but evaluation is limited by lack of intraarticular fluid/contrast.  Bones: No fracture or  dislocation. No aggressive osseous lesion.  Other: No fluid collection or hematoma.  IMPRESSION: 1. Moderate supraspinatus tendinosis with a high-grade partial-thickness articular surface tear with a small full-thickness component. 2. Mild infraspinatus tendinosis. 3. Mild tendinosis of the intra-articular portion of the long head of the biceps tendon. 4. Thickening of the inferior joint capsule as can be seen with adhesive capsulitis.  Electronically Signed   By: Julaine Blanch M.D.   On: 01/17/2024 08:39  Past Medical/Family/Surgical/Social History: Medications & Allergies reviewed per EMR, new medications updated. Patient Active Problem List   Diagnosis Date Noted   Angina pectoris (HCC) 01/24/2020   Family history of early CAD 01/24/2020   COPD, mild (HCC) 10/10/2019   Shortness of breath 07/25/2019   Hypertension    Hyperlipidemia    GERD (gastroesophageal reflux disease)    Past Medical History:  Diagnosis Date   Allergy    Cataract    COPD (chronic obstructive pulmonary disease) (HCC)    GERD (gastroesophageal reflux disease)    Hyperlipidemia    Hypertension    PONV (postoperative nausea and vomiting)    Thyroid  disease    Family History  Problem Relation Age of Onset   Heart attack Mother    Stroke Mother    Healthy Father    Dementia Brother    Heart attack Brother    Colon cancer Neg Hx    Esophageal cancer Neg Hx    Rectal cancer Neg Hx    Stomach cancer Neg Hx    Past Surgical History:  Procedure Laterality Date   APPENDECTOMY  ARTHRODESIS METATARSALPHALANGEAL JOINT (MTPJ) Left 07/07/2019   Procedure: Left hallux metatarsal phalangeal joint arthrodesis;  Surgeon: Kit Rush, MD;  Location: [facility];  Service: Orthopedics;  Laterality: Left;   BLADDER SUSPENSION     broken wrist Left    BUNIONECTOMY Left    CHOLECYSTECTOMY     HARDWARE REMOVAL Left 07/07/2019   Procedure: Hardware removal left foot;  Surgeon: Kit Rush,  MD;  Location: [facility];  Service: Orthopedics;  Laterality: Left;   THYROIDECTOMY     Social History   Occupational History   Not on file  Tobacco Use   Smoking status: Former    Current packs/day: 0.00    Average packs/day: 0.8 packs/day for 20.0 years (15.0 ttl pk-yrs)    Types: Cigarettes    Start date: 27    Quit date: 2019    Years since quitting: 6.6   Smokeless tobacco: Never  Vaping Use   Vaping status: Never Used  Substance and Sexual Activity   Alcohol  use: Yes    Comment: occasional wine   Drug use: No   Sexual activity: Not on file

## 2024-02-03 ENCOUNTER — Encounter: Payer: Self-pay | Admitting: Physical Therapy

## 2024-02-03 ENCOUNTER — Ambulatory Visit: Admitting: Physical Therapy

## 2024-02-03 DIAGNOSIS — M25511 Pain in right shoulder: Secondary | ICD-10-CM | POA: Diagnosis not present

## 2024-02-03 DIAGNOSIS — M25611 Stiffness of right shoulder, not elsewhere classified: Secondary | ICD-10-CM

## 2024-02-03 DIAGNOSIS — M6281 Muscle weakness (generalized): Secondary | ICD-10-CM | POA: Diagnosis not present

## 2024-02-03 DIAGNOSIS — R293 Abnormal posture: Secondary | ICD-10-CM

## 2024-02-03 DIAGNOSIS — G8929 Other chronic pain: Secondary | ICD-10-CM

## 2024-02-03 NOTE — Therapy (Addendum)
 OUTPATIENT PHYSICAL THERAPY UPPER EXTREMITY EVALUATION   Patient Name: Jennifer Bradley MRN: [id number] DOB:Dec 15, 1955, 68 y.o., female Today's Date: 02/03/2024  END OF SESSION:  PT End of Session - 02/03/24 1341     Visit Number 1    Number of Visits 12    Date for PT Re-Evaluation 03/16/24    Authorization Type [employer]    Authorization Time Period $10 COPAY    Progress Note Due on Visit 10    PT Start Time 1100    PT Stop Time 1150    PT Time Calculation (min) 50 min    Activity Tolerance Patient tolerated treatment well    Behavior During Therapy WFL for tasks assessed/performed          Past Medical History:  Diagnosis Date   Allergy    Cataract    COPD (chronic obstructive pulmonary disease) (HCC)    GERD (gastroesophageal reflux disease)    Hyperlipidemia    Hypertension    PONV (postoperative nausea and vomiting)    Thyroid  disease    Past Surgical History:  Procedure Laterality Date   APPENDECTOMY     ARTHRODESIS METATARSALPHALANGEAL JOINT (MTPJ) Left 07/07/2019   Procedure: Left hallux metatarsal phalangeal joint arthrodesis;  Surgeon: Kit Rush, MD;  Location: [facility];  Service: Orthopedics;  Laterality: Left;   BLADDER SUSPENSION     broken wrist Left    BUNIONECTOMY Left    CHOLECYSTECTOMY     HARDWARE REMOVAL Left 07/07/2019   Procedure: Hardware removal left foot;  Surgeon: Kit Rush, MD;  Location:  SURGERY CENTER;  Service: Orthopedics;  Laterality: Left;   THYROIDECTOMY     Patient Active Problem List   Diagnosis Date Noted   Angina pectoris (HCC) 01/24/2020   Family history of early CAD 01/24/2020   COPD, mild (HCC) 10/10/2019   Shortness of breath 07/25/2019   Hypertension    Hyperlipidemia    GERD (gastroesophageal reflux disease)     PCP: Shayne Anes, MD  REFERRING PROVIDER: Jerri Kay HERO, MD  REFERRING DIAG: [id number] (ICD-10-CM) - Chronic right shoulder pain   THERAPY DIAG:  Stiffness  of right shoulder, not elsewhere classified  Chronic right shoulder pain  Muscle weakness (generalized)  Abnormal posture  Rationale for Evaluation and Treatment: Rehabilitation  ONSET DATE: Referral date: 01/19/2024, pain onset: May 2025  SUBJECTIVE:                                                                                                                                                                                      SUBJECTIVE STATEMENT:   Patient is a 68 y.o. female who presents to  PT for chronic Rt shoulder pain. Patients pain started after a hard fall on her Rt shoulder back in May of 2025. Pain and stiffness has gone unchanged since the fall. The only time she had relief was for 2 weeks after first steroid injection. She had a second injection hoping it would do the same but it gave her no relief. Patient also reports random numbness/tingling down the arm.  Hand dominance: Right  PERTINENT HISTORY:  COPD, HTN, thyroid  disease  DIAGNOSTIC FINDINGS: MRI OF THE RIGHT SHOULDER WITHOUT CONTRAST    IMPRESSION: 1. Moderate supraspinatus tendinosis with a high-grade partial-thickness articular surface tear with a small full-thickness component. 2. Mild infraspinatus tendinosis. 3. Mild tendinosis of the intra-articular portion of the long head of the biceps tendon. 4. Thickening of the inferior joint capsule as can be seen with adhesive capsulitis.  PAIN:  Are you having pain? Yes: NPRS scale: 1/10 at rest, 3-4/10 with movement, 8-9/10 when arm catches, 5-6/10 when sleeping Pain location: anterior shoulder and biceps area Pain description: achy at all times but stabbing with catching sensation Aggravating factors: movement, ADLs, dressing, overhead  Relieving factors: rest  PRECAUTIONS: None  RED FLAGS: None   WEIGHT BEARING RESTRICTIONS: No  FALLS:  Has patient fallen in last 6 months? Yes. Number of falls 1 onset  LIVING ENVIRONMENT: Lives with: lives  with their family and alone Lives in: House/apartment Stairs: No Has following equipment at home: None  OCCUPATION: Computer work with occasional moving furniture   PLOF: Independent  PATIENT GOALS:  full movement with no pain  NEXT MD VISIT: 02/17/2024 office visit   OBJECTIVE:  Note: Objective measures were completed at Evaluation unless otherwise noted.  Patient-Specific Activity Scoring Scheme  0 represents "unable to perform." 10 represents "able to perform at prior level. 0 1 2 3 4 5 6 7 8 9  10 (Date and Score)  Activity Eval     1.  dressing 2     2.  overhead reaching  3    3.  lifting 4   4.     5.    Score 3    Total score = sum of the activity scores/number of activities Minimum detectable change (90%CI) for average score = 2 points Minimum detectable change (90%CI) for single activity score = 3 points  COGNITION: Overall cognitive status: Within functional limits for tasks assessed     SENSATION: WFL  POSTURE: Forward head, round shoulders, Rt shoulder lower than Lt shoulder, trunk rotation to the Rt  UPPER EXTREMITY ROM:   ROM Right Eval Supine   Shoulder flexion A: 86* P: 119* w/ pain end feel   Shoulder extension A: 21* P: 26* w/ pain end   Shoulder abduction A: 83* P: 87* w/ pain end feel   Shoulder adduction   Shoulder internal rotation A: 42* P; 49* w/ muscular end feel  Shoulder external rotation A: 38* P: 38*w/ musuclar end feel  Elbow flexion   Elbow extension   Wrist flexion   Wrist extension   Wrist ulnar deviation   Wrist radial deviation   Wrist pronation   Wrist supination   (Blank rows = not tested)  UPPER EXTREMITY MMT:  MMT Right eval Left eval  Shoulder flexion 3-/5 3-/5  Shoulder extension    Shoulder abduction 3-/5 3-/5  Shoulder adduction    Shoulder internal rotation    Shoulder external rotation    Middle trapezius    Lower trapezius    Elbow flexion  Elbow extension    Wrist flexion    Wrist  extension    Wrist ulnar deviation    Wrist radial deviation    Wrist pronation    Wrist supination    Grip strength (lbs)    (Blank rows = not tested)  SHOULDER SPECIAL TESTS: not tested   JOINT MOBILITY TESTING:  Scapular mobility WFL  PALPATION:  Tender to touch near biceps tendon  TODAY'S TREATMENT:                                                                                                       DATE: Therapeutic Exercise: HEP instruction/performance c cues for techniques, handout provided.  Trial set performed of each for comprehension and symptom assessment.  See below for exercise list   PATIENT EDUCATION: Education details: HEP, POC Person educated: Patient Education method: Explanation, Demonstration, Verbal cues, and Handouts Education comprehension: verbalized understanding, returned demonstration, and verbal cues required  HOME EXERCISE PROGRAM: Access Code: [email or web address] URL: [email or web address] Date: 02/03/2024 Prepared by: Ismael Theophilus Stallion  Exercises - Seated Scapular Retraction  - 1-3 x daily - 7 x weekly - 2 sets - 10 reps - 5 hold - Seated Cervical Retraction  - 1-3 x daily - 7 x weekly - 2 sets - 10 reps - 5 hold - Supine Shoulder Flexion with Dowel  - 1-2 x daily - 7 x weekly - 2 sets - 10 reps - 5 hold - Supine Shoulder Abduction AAROM with Dowel  - 1-2 x daily - 7 x weekly - 2 sets - 10 reps - 5 hold - Standing Shoulder Extension with Dowel  - 1-2 x daily - 7 x weekly - 2 sets - 10 reps - 5 hold - Circular Shoulder Pendulum with Table Support  - 1-3 x daily - 7 x weekly - 1-2 sets - 10 reps - Flexion-Extension Shoulder Pendulum with Table Support  - 1-3 x daily - 7 x weekly - 1-2 sets - 10 reps - Horizontal Shoulder Pendulum with Table Support  - 1-3 x daily - 7 x weekly - 1-2 sets - 10 reps  ASSESSMENT:  CLINICAL IMPRESSION: Patient is a 68 y.o. who comes to clinic with complaints of Rt shoulder chronic pain with mobility,  strength and movement coordination deficits that impair their ability to perform usual daily and recreational functional activities without increase difficulty/symptoms at this time.  Patient to benefit from skilled PT services to address impairments and limitations to improve to previous level of function without restriction secondary to condition.   OBJECTIVE IMPAIRMENTS: decreased activity tolerance, decreased coordination, decreased endurance, decreased mobility, decreased ROM, decreased strength, hypomobility, impaired flexibility, impaired UE functional use, postural dysfunction, and pain.   ACTIVITY LIMITATIONS: carrying, lifting, sleeping, bed mobility, dressing, self feeding, reach over head, hygiene/grooming, and caring for others  PARTICIPATION LIMITATIONS: meal prep, cleaning, shopping, and community activity  PERSONAL FACTORS: Fitness, Past/current experiences, and Time since onset of injury/illness/exacerbation are also affecting patient's functional outcome.   REHAB POTENTIAL: Good  CLINICAL DECISION MAKING: Stable/uncomplicated  EVALUATION COMPLEXITY: Low  GOALS: Goals reviewed with patient? Yes  SHORT TERM GOALS: (target date for Short term goals  02/29/2024)  1.Patient will demonstrate independent use of home exercise program to maintain progress from in clinic treatments. Baseline: SEE OBJECTIVE DATA Goal status: INITIAL  LONG TERM GOALS: (target dates for all long term goals 04/01/2024 )   1. Patient will demonstrate/report pain at worst less than or equal to 2/10 to facilitate minimal limitation in daily activity secondary to pain symptoms. Baseline: SEE OBJECTIVE DATA Goal status: INITIAL   2. Patient will demonstrate independent use of home exercise program to facilitate ability to maintain/progress functional gains from skilled physical therapy services. Baseline: SEE OBJECTIVE DATA Goal status: INITIAL   3.  Patient reports Patient-Specific Activity Score  improved to >6 to indicate improvement in functional activities.  Baseline: SEE OBJECTIVE DATA Goal status: INITIAL   4.  Patient will demonstrate Rt shoulder MMT >/= 4/5 throughout to facilitate lifting, reaching, carrying at [facility] in daily activity.  Baseline: SEE OBJECTIVE DATA Goal status: INITIAL   5.  Patient will demonstrate Rt shoulder AROM WFL s symptoms to facilitate usual overhead reaching, self care, dressing at PLOF.  Baseline: SEE OBJECTIVE DATA Goal status: INITIAL    PLAN: PT FREQUENCY: 2x/week  PT DURATION: 6 weeks  PLANNED INTERVENTIONS: 97164- PT Re-evaluation, 97750- Physical Performance Testing, 97110-Therapeutic exercises, 97530- Therapeutic activity, [id number]- Neuromuscular re-education, 97535- Self Care, [id number]- Manual therapy, Patient/Family education, Joint mobilization, and Moist heat  PLAN FOR NEXT SESSION: Review/update HEP,  manual therapy for ROM   Ismael Nap, Student-PT 02/03/2024, 4:02 PM  This entire session of physical therapy was performed under the direct supervision of PT signing evaluation /treatment. PT reviewed note and agrees.   Robin Waldron, PT, DPT 02/03/2024, 5:23 PM

## 2024-02-08 ENCOUNTER — Encounter: Payer: Self-pay | Admitting: Surgery

## 2024-02-08 ENCOUNTER — Ambulatory Visit: Attending: Surgery | Admitting: Surgery

## 2024-02-08 VITALS — BP 114/79 | HR 73 | Temp 98.1°F | Ht 71.0 in | Wt 203.0 lb

## 2024-02-08 DIAGNOSIS — I701 Atherosclerosis of renal artery: Secondary | ICD-10-CM

## 2024-02-08 NOTE — Progress Notes (Signed)
 [facility] of [facility]  Patient name: Markeia Harkless MRN: [id number] DOB: [birth year] Sex: female   REQUESTING PROVIDER:    Dr. Shayne   REASON FOR CONSULT:    RAS  HISTORY OF PRESENT ILLNESS:   Alexxis Mackert is a 68 y.o. female, who is referred for evaluation of renal artery stenosis in the setting of difficult to control hypertension.  The patient states that she has occasional significant elevations in her systolic pressure at night, greater than 200.  This causes some dizziness and lightheadedness for her.  She states that after changing her medicines that these episodes have gone away.  She was still gets hypertensive measurements at night but they are not symptomatic.  The patient has a history of smoking and suffers from COPD.  She takes a statin for hypercholesterolemia.  PAST MEDICAL HISTORY    Past Medical History:  Diagnosis Date   Allergy    Cataract    COPD (chronic obstructive pulmonary disease) (HCC)    GERD (gastroesophageal reflux disease)    Hyperlipidemia    Hypertension    PONV (postoperative nausea and vomiting)    Thyroid  disease      FAMILY HISTORY   Family History  Problem Relation Age of Onset   Heart attack Mother    Stroke Mother    Healthy Father    Dementia Brother    Heart attack Brother    Colon cancer Neg Hx    Esophageal cancer Neg Hx    Rectal cancer Neg Hx    Stomach cancer Neg Hx     SOCIAL HISTORY:   Social History   Socioeconomic History   Marital status: Widowed    Spouse name: Not on file   Number of children: 3   Years of education: Not on file   Highest education level: Not on file  Occupational History   Not on file  Tobacco Use   Smoking status: Former    Current packs/day: 0.00    Average packs/day: 0.8 packs/day for 20.0 years (15.0 ttl pk-yrs)    Types: Cigarettes    Start date: 2    Quit date: 2019    Years since quitting: 6.6   Smokeless tobacco: Never   Vaping Use   Vaping status: Never Used  Substance and Sexual Activity   Alcohol  use: Yes    Comment: occasional wine   Drug use: No   Sexual activity: Not on file  Other Topics Concern   Not on file  Social History Narrative   Not on file   Social Drivers of Health   Financial Resource Strain: Not on file  Food Insecurity: Not on file  Transportation Needs: Not on file  Physical Activity: Not on file  Stress: Not on file  Social Connections: Not on file  Intimate Partner Violence: Not on file    ALLERGIES:    Allergies  Allergen Reactions   Mirtazapine     Other Reaction(s): knocked her out.  stopped it in 3 days   Penicillins Rash    CURRENT MEDICATIONS:    Current Outpatient Medications  Medication Sig Dispense Refill   hydrochlorothiazide (HYDRODIURIL) 25 MG tablet 25 mg.   3   levothyroxine (SYNTHROID) 100 MCG tablet Take 100 mcg by mouth daily before breakfast.     metoprolol  tartrate (LOPRESSOR ) 100 MG tablet TAKE 1 TABLET(100 MG) BY MOUTH TWICE DAILY 180 tablet 0   olmesartan (BENICAR) 20 MG tablet Take 20 mg by mouth daily.  pantoprazole (PROTONIX) 40 MG tablet 40 mg daily.  11   rosuvastatin (CRESTOR) 20 MG tablet Take 20 mg by mouth daily.   11   Tiotropium Bromide Monohydrate  (SPIRIVA  RESPIMAT) 2.5 MCG/ACT AERS Inhale 2 puffs into the lungs daily. 4 g 6   No current facility-administered medications for this visit.    REVIEW OF SYSTEMS:   [X]  denotes positive finding, [ ]  denotes negative finding Cardiac  Comments:  Chest pain or chest pressure:    Shortness of breath upon exertion:    Short of breath when lying flat:    Irregular heart rhythm:        Vascular    Pain in calf, thigh, or hip brought on by ambulation:    Pain in feet at night that wakes you up from your sleep:     Blood clot in your veins:    Leg swelling:         Pulmonary    Oxygen at home:    Productive cough:     Wheezing:         Neurologic    Sudden weakness in  arms or legs:     Sudden numbness in arms or legs:     Sudden onset of difficulty speaking or slurred speech:    Temporary loss of vision in one eye:     Problems with dizziness:         Gastrointestinal    Blood in stool:      Vomited blood:         Genitourinary    Burning when urinating:     Blood in urine:        Psychiatric    Major depression:         Hematologic    Bleeding problems:    Problems with blood clotting too easily:        Skin    Rashes or ulcers:        Constitutional    Fever or chills:     PHYSICAL EXAM:   Vitals:   02/08/24 0900  BP: 114/79  Pulse: 73  Temp: 98.1 F (36.7 C)  SpO2: 95%  Weight: 203 lb (92.1 kg)  Height: 5' 11 (1.803 m)    GENERAL: The patient is a well-nourished female, in no acute distress. The vital signs are documented above. CARDIAC: There is a regular rate and rhythm.  VASCULAR: No carotid bruits. PULMONARY: Nonlabored respirations ABDOMEN: Soft and non-tender.  No abdominal bruits MUSCULOSKELETAL: There are no major deformities or cyanosis. NEUROLOGIC: No focal weakness or paresthesias are detected. SKIN: There are no ulcers or rashes noted. PSYCHIATRIC: The patient has a normal affect.  STUDIES:   I have reviewed the following:  Peak systolic velocity of 218 centimeters/second in the mid right renal arteries suggestive of stenosis. The right renal artery ratio is less than 3.5 suggesting less than 60% stenosis.   No sonographic evidence of stenosis on the left.  ASSESSMENT and PLAN   Renal artery stenosis: The patient states that her symptoms have resolved with medical changes however she does note elevated pressures at night.  They tend to be normal in the morning.  She has an ultrasound that is suggestive of a stenosis in the right renal artery however this appears to be less than 60%.  We discussed that I suspect her renal artery narrowing is likely not the cause of her hypertension.  The stenosis is  less than 60%.  At this point  I would not recommend further evaluation however I would like for her to come back in 6 months for repeat ultrasound to confirm her initial ultrasound findings   Malvina New, IV, MD, FACS [facility] [phone] Pager [phone]

## 2024-02-10 ENCOUNTER — Other Ambulatory Visit: Payer: Self-pay

## 2024-02-10 DIAGNOSIS — I701 Atherosclerosis of renal artery: Secondary | ICD-10-CM

## 2024-02-17 ENCOUNTER — Encounter: Payer: Self-pay | Admitting: Sports Medicine

## 2024-02-17 ENCOUNTER — Ambulatory Visit (INDEPENDENT_AMBULATORY_CARE_PROVIDER_SITE_OTHER): Admitting: Sports Medicine

## 2024-02-17 DIAGNOSIS — G8929 Other chronic pain: Secondary | ICD-10-CM

## 2024-02-17 DIAGNOSIS — M25511 Pain in right shoulder: Secondary | ICD-10-CM | POA: Diagnosis not present

## 2024-02-17 DIAGNOSIS — M7501 Adhesive capsulitis of right shoulder: Secondary | ICD-10-CM | POA: Diagnosis not present

## 2024-02-17 NOTE — Progress Notes (Signed)
 Patient is feeling great from her injection. She feels she has most, if not all, of her motion back, with very little pain. She went to one appointment of physical therapy where they gave her home exercises and stretches to do. She is in the process of moving, so has not had much time to do the exercises or go back to PT. She has been using the arm a lot with packing and moving. She is wondering if she needs to go to more physical therapy, or if she can continue to do her exercises on her own.  Patient was instructed in 10 minutes of therapeutic exercises for right shoulder to improve strength, ROM and function according to my instructions and plan of care by a Certified Athletic Trainer during the office visit. A customized handout was provided and demonstration of proper technique shown and discussed. Patient did perform exercises and demonstrate understanding through teachback.  All questions discussed and answered.

## 2024-02-17 NOTE — Progress Notes (Signed)
 Jennifer Bradley - 68 y.o. female MRN [id number]  Date of birth: February 22, 1956  Office Visit Note: Visit Date: 02/17/2024 PCP: Shayne Anes, MD Referred by: Shayne Anes, MD  Subjective: Chief Complaint  Patient presents with   Right Shoulder - Follow-up   HPI: Jennifer Bradley is a pleasant 68 y.o. female who presents today for f/u of chronic right shoulder pain with adhesive capsulitis.   Jennifer Bradley is doing excellent.  It took a few days up to a week for her glenohumeral joint injection to kick in following this she has had excellent relief of her pain and marked improvement in her range of motion.  Essentially has no pain and feels her range of motion is almost completely back to normal.  She did have 1 initial eval the physical therapy, but has had difficulty getting her in/scheduled.  Pertinent ROS were reviewed with the patient and found to be negative unless otherwise specified above in HPI.   Assessment & Plan: Visit Diagnoses:  1. Adhesive capsulitis of right shoulder   2. Chronic right shoulder pain    Plan: Impression is significantly improved chronic right shoulder pain secondary to adhesive capsulitis status post ultrasound-guided glenohumeral joint injection.  She has near equivocal full range of motion and essentially no pain.  Given her marked improvement status post her volume glenohumeral joint injection, we do not need to repeat.  I would like her to be consistent with PT/home exercise regimen to further distend the capsule and prevent re-freezing.  She prefers home versus formal PT.  We did print out a customized frozen shoulder rehab program and my athletic trainer, Jinnie, did review these with her in the room today.  I would like her to stay consistent with these on a daily basis for the next month.  After that point if she is doing great, she may discontinue and return to regular everyday activity.  Okay for Tylenol  and or heat as needed.  She will follow-up with me PRN.  Follow-up: Return  if symptoms worsen or fail to improve.   Meds & Orders: No orders of the defined types were placed in this encounter.  No orders of the defined types were placed in this encounter.    Procedures: No procedures performed      Clinical History: No specialty comments available.  She reports that she quit smoking about 6 years ago. Her smoking use included cigarettes. She started smoking about 26 years ago. She has a 15 pack-year smoking history. She has never used smokeless tobacco. No results for input(s): HGBA1C, LABURIC in the last 8760 hours.  Objective:    Physical Exam  Gen: Well-appearing, in no acute distress; non-toxic CV: Well-perfused. Warm.  Resp: Breathing unlabored on room air; no wheezing. Psych: Fluid speech in conversation; appropriate affect; normal thought process  Ortho Exam - Right shoulder: There is no redness swelling or effusion.  There is gross restriction in both passive and active range of motion, as follows:   -Flexion: 100 degrees --> 175 degrees - Abduction: 95 degrees --> 170 degrees - External rotation: 40 degrees --> 65 degrees - Internal rotation: Thumb to SI joint --> Thumb to T12 (compared to T10 cont arm)  Imaging: No results found.  Past Medical/Family/Surgical/Social History: Medications & Allergies reviewed per EMR, new medications updated. Patient Active Problem List   Diagnosis Date Noted   Angina pectoris (HCC) 01/24/2020   Family history of early CAD 01/24/2020   COPD, mild (HCC) 10/10/2019   Shortness of  breath 07/25/2019   Hypertension    Hyperlipidemia    GERD (gastroesophageal reflux disease)    Past Medical History:  Diagnosis Date   Allergy    Cataract    COPD (chronic obstructive pulmonary disease) (HCC)    GERD (gastroesophageal reflux disease)    Hyperlipidemia    Hypertension    PONV (postoperative nausea and vomiting)    Thyroid  disease    Family History  Problem Relation Age of Onset   Heart attack  Mother    Stroke Mother    Healthy Father    Dementia Brother    Heart attack Brother    Colon cancer Neg Hx    Esophageal cancer Neg Hx    Rectal cancer Neg Hx    Stomach cancer Neg Hx    Past Surgical History:  Procedure Laterality Date   APPENDECTOMY     ARTHRODESIS METATARSALPHALANGEAL JOINT (MTPJ) Left 07/07/2019   Procedure: Left hallux metatarsal phalangeal joint arthrodesis;  Surgeon: Kit Rush, MD;  Location: [facility];  Service: Orthopedics;  Laterality: Left;   BLADDER SUSPENSION     broken wrist Left    BUNIONECTOMY Left    CHOLECYSTECTOMY     HARDWARE REMOVAL Left 07/07/2019   Procedure: Hardware removal left foot;  Surgeon: Kit Rush, MD;  Location: [facility];  Service: Orthopedics;  Laterality: Left;   THYROIDECTOMY     Social History   Occupational History   Not on file  Tobacco Use   Smoking status: Former    Current packs/day: 0.00    Average packs/day: 0.8 packs/day for 20.0 years (15.0 ttl pk-yrs)    Types: Cigarettes    Start date: 5    Quit date: 2019    Years since quitting: 6.6   Smokeless tobacco: Never  Vaping Use   Vaping status: Never Used  Substance and Sexual Activity   Alcohol  use: Yes    Comment: occasional wine   Drug use: No   Sexual activity: Not on file

## 2024-02-22 ENCOUNTER — Encounter

## 2024-02-29 ENCOUNTER — Encounter

## 2024-03-07 ENCOUNTER — Encounter

## 2024-04-07 DIAGNOSIS — E039 Hypothyroidism, unspecified: Secondary | ICD-10-CM | POA: Diagnosis not present

## 2024-04-18 ENCOUNTER — Encounter: Payer: Self-pay | Admitting: Radiology

## 2024-07-19 ENCOUNTER — Other Ambulatory Visit: Payer: Self-pay | Admitting: Internal Medicine

## 2024-07-19 DIAGNOSIS — I701 Atherosclerosis of renal artery: Secondary | ICD-10-CM

## 2024-08-04 ENCOUNTER — Other Ambulatory Visit

## 2024-08-08 ENCOUNTER — Ambulatory Visit: Admitting: Surgery

## 2024-08-08 ENCOUNTER — Encounter (HOSPITAL_COMMUNITY)
